# Patient Record
Sex: Female | Born: 1990 | Race: White | Hispanic: No | Marital: Single | Smoking: Current every day smoker
Health system: Southern US, Community
[De-identification: ages and names within clinical notes are randomized; demographics above are authoritative.]

## PROBLEM LIST (undated history)

## (undated) DIAGNOSIS — K219 Gastro-esophageal reflux disease without esophagitis: Secondary | ICD-10-CM

## (undated) DIAGNOSIS — O0990 Supervision of high risk pregnancy, unspecified, unspecified trimester: Secondary | ICD-10-CM

## (undated) DIAGNOSIS — Z789 Other specified health status: Secondary | ICD-10-CM

## (undated) HISTORY — PX: DILATION AND CURETTAGE, DIAGNOSTIC / THERAPEUTIC: SUR384

## (undated) HISTORY — DX: Gastro-esophageal reflux disease without esophagitis: K21.9

---
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
## 2015-10-15 ENCOUNTER — Encounter: Payer: Self-pay | Admitting: Family

## 2015-10-29 ENCOUNTER — Encounter: Payer: Self-pay | Admitting: *Deleted

## 2015-10-29 ENCOUNTER — Other Ambulatory Visit (HOSPITAL_COMMUNITY)
Admission: RE | Admit: 2015-10-29 | Discharge: 2015-10-29 | Disposition: A | Discharge status: Home / Self Care | Payer: Medicaid Other | Source: Ambulatory Visit | Attending: Family | Admitting: Family

## 2015-10-29 ENCOUNTER — Encounter: Payer: Self-pay | Admitting: Advanced Practice Midwife

## 2015-10-29 ENCOUNTER — Ambulatory Visit (INDEPENDENT_AMBULATORY_CARE_PROVIDER_SITE_OTHER): Payer: Medicaid Other | Admitting: Advanced Practice Midwife

## 2015-10-29 VITALS — BP 120/75 | HR 84 | Ht 64.0 in | Wt 143.0 lb

## 2015-10-29 DIAGNOSIS — Z113 Encounter for screening for infections with a predominantly sexual mode of transmission: Secondary | ICD-10-CM | POA: Insufficient documentation

## 2015-10-29 DIAGNOSIS — Z124 Encounter for screening for malignant neoplasm of cervix: Secondary | ICD-10-CM | POA: Diagnosis not present

## 2015-10-29 DIAGNOSIS — Z3689 Encounter for other specified antenatal screening: Secondary | ICD-10-CM | POA: Diagnosis not present

## 2015-10-29 DIAGNOSIS — Z8742 Personal history of other diseases of the female genital tract: Secondary | ICD-10-CM | POA: Insufficient documentation

## 2015-10-29 DIAGNOSIS — O0990 Supervision of high risk pregnancy, unspecified, unspecified trimester: Secondary | ICD-10-CM

## 2015-10-29 DIAGNOSIS — Z01419 Encounter for gynecological examination (general) (routine) without abnormal findings: Secondary | ICD-10-CM | POA: Diagnosis not present

## 2015-10-29 DIAGNOSIS — Z3491 Encounter for supervision of normal pregnancy, unspecified, first trimester: Secondary | ICD-10-CM | POA: Diagnosis not present

## 2015-10-29 DIAGNOSIS — Z8759 Personal history of other complications of pregnancy, childbirth and the puerperium: Secondary | ICD-10-CM

## 2015-10-29 DIAGNOSIS — Z3401 Encounter for supervision of normal first pregnancy, first trimester: Secondary | ICD-10-CM

## 2015-10-29 HISTORY — DX: Supervision of high risk pregnancy, unspecified, unspecified trimester: O09.90

## 2015-10-29 LAB — HEMOGLOBIN A1C
HEMOGLOBIN A1C: 4.9 % (ref <5.7)
Mean Plasma Glucose: 94 mg/dL

## 2015-10-29 NOTE — Patient Instructions (Signed)
Pap Test WHY AM I HAVING THIS TEST? A pap test is sometimes called a pap smear. It is a screening test that is used to check for signs of cancer of the vagina, cervix, and uterus. The test can also identify the presence of infection or precancerous changes. Your health care provider will likely recommend you have this test done on a regular basis. This test may be done:  Every 3 years, starting at age 26.  Every 5 years, in combination with testing for the presence of human papillomavirus (HPV).  More or less often depending on other medical conditions.  WHAT KIND OF SAMPLE IS TAKEN? Using a small cotton swab, plastic spatula, or brush, your health care provider will collect a sample of cells from the surface of your cervix. Your cervix is the opening to your uterus, also called a womb. Secretions from the cervix and vagina may also be collected. HOW DO I PREPARE FOR THE TEST?  Be aware of where you are in your menstrual cycle. You may be asked to reschedule the test if you are menstruating on the day of the test.  You may need to reschedule if you have a known vaginal infection on the day of the test.  You may be asked to avoid douching or taking a bath the day before or the day of the test.  Some medicines can cause abnormal test results, such as digitalis and tetracycline. Talk with your health care provider before your test if you take one of these medicines. WHAT DO THE RESULTS MEAN? Abnormal test results may indicate a number of health conditions. These may include:  Cancer. Although pap test results cannot be used to diagnose cancer of the cervix, vagina, or uterus, they may suggest the possibility of cancer. Further tests would be required to determine if cancer is present.  Sexually transmitted disease.  Fungal infection.  Parasite infection.  Herpes infection.  A condition causing or contributing to infertility. It is your responsibility to obtain your test results. Ask  the lab or department performing the test when and how you will get your results. Contact your health care provider to discuss any questions you have about your results.   This information is not intended to replace advice given to you by your health care provider. Make sure you discuss any questions you have with your health care provider.   Document Released: 03/11/2002 Document Revised: 01/09/2014 Document Reviewed: 05/12/2013 Elsevier Interactive Patient Education [employer]2016 Elsevier Inc. First Trimester of Pregnancy The first trimester of pregnancy is from week 1 until the end of week 12 (months 1 through 3). A week after a sperm fertilizes an egg, the egg will implant on the wall of the uterus. This embryo will begin to develop into a baby. Genes from you and your partner are forming the baby. The female genes determine whether the baby is a boy or a girl. At 6-8 weeks, the eyes and face are formed, and the heartbeat can be seen on ultrasound. At the end of 12 weeks, all the baby's organs are formed.  Now that you are pregnant, you will want to do everything you can to have a healthy baby. Two of the most important things are to get good prenatal care and to follow your health care provider's instructions. Prenatal care is all the medical care you receive before the baby's birth. This care will help prevent, find, and treat any problems during the pregnancy and childbirth. BODY CHANGES Your body goes  through many changes during pregnancy. The changes vary from woman to woman.   You may gain or lose a couple of pounds at first.  You may feel sick to your stomach (nauseous) and throw up (vomit). If the vomiting is uncontrollable, call your health care provider.  You may tire easily.  You may develop headaches that can be relieved by medicines approved by your health care provider.  You may urinate more often. Painful urination may mean you have a bladder infection.  You may develop heartburn as a  result of your pregnancy.  You may develop constipation because certain hormones are causing the muscles that push waste through your intestines to slow down.  You may develop hemorrhoids or swollen, bulging veins (varicose veins).  Your breasts may begin to grow larger and become tender. Your nipples may stick out more, and the tissue that surrounds them (areola) may become darker.  Your gums may bleed and may be sensitive to brushing and flossing.  Dark spots or blotches (chloasma, mask of pregnancy) may develop on your face. This will likely fade after the baby is born.  Your menstrual periods will stop.  You may have a loss of appetite.  You may develop cravings for certain kinds of food.  You may have changes in your emotions from day to day, such as being excited to be pregnant or being concerned that something may go wrong with the pregnancy and baby.  You may have more vivid and strange dreams.  You may have changes in your hair. These can include thickening of your hair, rapid growth, and changes in texture. Some women also have hair loss during or after pregnancy, or hair that feels dry or thin. Your hair will most likely return to normal after your baby is born. WHAT TO EXPECT AT YOUR PRENATAL VISITS During a routine prenatal visit:  You will be weighed to make sure you and the baby are growing normally.  Your blood pressure will be taken.  Your abdomen will be measured to track your baby's growth.  The fetal heartbeat will be listened to starting around week 10 or 12 of your pregnancy.  Test results from any previous visits will be discussed. Your health care provider may ask you:  How you are feeling.  If you are feeling the baby move.  If you have had any abnormal symptoms, such as leaking fluid, bleeding, severe headaches, or abdominal cramping.  If you are using any tobacco products, including cigarettes, chewing tobacco, and electronic cigarettes.  If you  have any questions. Other tests that may be performed during your first trimester include:  Blood tests to find your blood type and to check for the presence of any previous infections. They will also be used to check for low iron levels (anemia) and Rh antibodies. Later in the pregnancy, blood tests for diabetes will be done along with other tests if problems develop.  Urine tests to check for infections, diabetes, or protein in the urine.  An ultrasound to confirm the proper growth and development of the baby.  An amniocentesis to check for possible genetic problems.  Fetal screens for spina bifida and Down syndrome.  You may need other tests to make sure you and the baby are doing well.  HIV (human immunodeficiency virus) testing. Routine prenatal testing includes screening for HIV, unless you choose not to have this test. HOME CARE INSTRUCTIONS  Medicines  Follow your health care provider's instructions regarding medicine use. Specific medicines  may be either safe or unsafe to take during pregnancy.  Take your prenatal vitamins as directed.  If you develop constipation, try taking a stool softener if your health care provider approves. Diet  Eat regular, well-balanced meals. Choose a variety of foods, such as meat or vegetable-based protein, fish, milk and low-fat dairy products, vegetables, fruits, and whole grain breads and cereals. Your health care provider will help you determine the amount of weight gain that is right for you.  Avoid raw meat and uncooked cheese. These carry germs that can cause birth defects in the baby.  Eating four or five small meals rather than three large meals a day may help relieve nausea and vomiting. If you start to feel nauseous, eating a few soda crackers can be helpful. Drinking liquids between meals instead of during meals also seems to help nausea and vomiting.  If you develop constipation, eat more high-fiber foods, such as fresh vegetables or  fruit and whole grains. Drink enough fluids to keep your urine clear or pale yellow. Activity and Exercise  Exercise only as directed by your health care provider. Exercising will help you:  Control your weight.  Stay in shape.  Be prepared for labor and delivery.  Experiencing pain or cramping in the lower abdomen or low back is a good sign that you should stop exercising. Check with your health care provider before continuing normal exercises.  Try to avoid standing for long periods of time. Move your legs often if you must stand in one place for a long time.  Avoid heavy lifting.  Wear low-heeled shoes, and practice good posture.  You may continue to have sex unless your health care provider directs you otherwise. Relief of Pain or Discomfort  Wear a good support bra for breast tenderness.   Take warm sitz baths to soothe any pain or discomfort caused by hemorrhoids. Use hemorrhoid cream if your health care provider approves.   Rest with your legs elevated if you have leg cramps or low back pain.  If you develop varicose veins in your legs, wear support hose. Elevate your feet for 15 minutes, 3-4 times a day. Limit salt in your diet. Prenatal Care  Schedule your prenatal visits by the twelfth week of pregnancy. They are usually scheduled monthly at first, then more often in the last 2 months before delivery.  Write down your questions. Take them to your prenatal visits.  Keep all your prenatal visits as directed by your health care provider. Safety  Wear your seat belt at all times when driving.  Make a list of emergency phone numbers, including numbers for family, friends, the hospital, and police and fire departments. General Tips  Ask your health care provider for a referral to a local prenatal education class. Begin classes no later than at the beginning of month 6 of your pregnancy.  Ask for help if you have counseling or nutritional needs during pregnancy. Your  health care provider can offer advice or refer you to specialists for help with various needs.  Do not use hot tubs, steam rooms, or saunas.  Do not douche or use tampons or scented sanitary pads.  Do not cross your legs for long periods of time.  Avoid cat litter boxes and soil used by cats. These carry germs that can cause birth defects in the baby and possibly loss of the fetus by miscarriage or stillbirth.  Avoid all smoking, herbs, alcohol, and medicines not prescribed by your health care provider.  Chemicals in these affect the formation and growth of the baby.  Do not use any tobacco products, including cigarettes, chewing tobacco, and electronic cigarettes. If you need help quitting, ask your health care provider. You may receive counseling support and other resources to help you quit.  Schedule a dentist appointment. At home, brush your teeth with a soft toothbrush and be gentle when you floss. SEEK MEDICAL CARE IF:   You have dizziness.  You have mild pelvic cramps, pelvic pressure, or nagging pain in the abdominal area.  You have persistent nausea, vomiting, or diarrhea.  You have a bad smelling vaginal discharge.  You have pain with urination.  You notice increased swelling in your face, hands, legs, or ankles. SEEK IMMEDIATE MEDICAL CARE IF:   You have a fever.  You are leaking fluid from your vagina.  You have spotting or bleeding from your vagina.  You have severe abdominal cramping or pain.  You have rapid weight gain or loss.  You vomit blood or material that looks like coffee grounds.  You are exposed to [place] measles and have never had them.  You are exposed to fifth disease or chickenpox.  You develop a severe headache.  You have shortness of breath.  You have any kind of trauma, such as from a fall or a car accident.   This information is not intended to replace advice given to you by your health care provider. Make sure you discuss any  questions you have with your health care provider.   Document Released: 12/13/2000 Document Revised: 01/09/2014 Document Reviewed: 10/29/2012 Elsevier Interactive Patient Education [employer]

## 2015-10-29 NOTE — Progress Notes (Signed)
Bedside U/S shows IUP and CRL is 53.365mm GA is 12w and pos FHT

## 2015-10-29 NOTE — Progress Notes (Signed)
  Subjective:    Teresa Blanchard is a [id number] 10448w6d being seen today for her first obstetrical visit.  Her obstetrical history is significant for History of 16 week loss, unclear if it was abruption or incompletent cervix. Patient does intend to breast feed. Pregnancy history fully reviewed.  Patient reports no complaints.  Vitals:   10/29/15 0853 10/29/15 0855  BP: 120/75   Pulse: 84   Weight: 143 lb (64.9 kg)   Height:  5\' 4"  (1.626 m)    HISTORY: OB History  Gravida Para Term Preterm AB Living  3 1 1   1 1   SAB TAB Ectopic Multiple Live Births  1       1    # Outcome Date GA Lbr Len/2nd Weight Sex Delivery Anes PTL Lv  3 Current           2 SAB 2014 3772w0d      N FD  1 Term 2013   7 lb 11 oz (3.487 kg)  Vag-Spont  N LIV     History reviewed. No pertinent past medical history. Past Surgical History:  Procedure Laterality Date  . DILATION AND CURETTAGE, DIAGNOSTIC / THERAPEUTIC     Family History  Problem Relation Age of Onset  . Breast cancer Other      Exam    Uterus:  Fundal Height: 12 cm  Pelvic Exam:    Perineum: No Hemorrhoids, Normal Perineum   Vulva: Bartholin's, Urethra, Skene's normal   Vagina:  normal mucosa, normal discharge   pH:    Cervix: multiparous appearance   Adnexa: no mass, fullness, tenderness   Bony Pelvis: gynecoid  System: Breast:  normal appearance, no masses or tenderness   Skin: normal coloration and turgor, no rashes    Neurologic: oriented, grossly non-focal   Extremities: normal strength, tone, and muscle mass   HEENT neck supple with midline trachea   Mouth/Teeth mucous membranes moist, pharynx normal without lesions   Neck supple   Cardiovascular: regular rate and rhythm   Respiratory:  appears well, vitals normal, no respiratory distress, acyanotic, normal RR, ear and throat exam is normal, neck free of mass or lymphadenopathy, chest clear, no wheezing, crepitations, rhonchi, normal symmetric air entry   Abdomen: soft,  non-tender; bowel sounds normal; no masses,  no organomegaly   Urinary: urethral meatus normal   Pap done   Assessment:    Pregnancy: G3P1011 Patient Active Problem List   Diagnosis Date Noted  . History of 1 spontaneous abortion 10/29/2015  . Supervision of normal pregnancy in first trimester 10/29/2015        Plan:     Initial labs drawn. Prenatal vitamins. Problem list reviewed and updated. Genetic Screening discussed First Screen: ordered.  Ultrasound discussed; fetal survey: requested.  Follow up in 4 weeks. 50% of 30 min visit spent on counseling and coordination of care.   Routines reviewed  [facility] week loss (second preg)  She delivered at home with PPROM and then hemorrhaged. Unclear if it was incompetent cervix, PPROM, or abruption   [facility] Center[facility]ILLIAMS,Remas Sobel 10/29/2015

## 2015-10-30 LAB — GLUCOSE, RANDOM: Glucose, Bld: 70 mg/dL (ref 65–99)

## 2015-10-31 LAB — CULTURE, OB URINE
COLONY COUNT: NO GROWTH
ORGANISM ID, BACTERIA: NO GROWTH

## 2015-11-01 LAB — CYTOLOGY - PAP
CHLAMYDIA, DNA PROBE: NEGATIVE
DIAGNOSIS: NEGATIVE
NEISSERIA GONORRHEA: NEGATIVE

## 2015-11-01 LAB — PRENATAL PROFILE (SOLSTAS)
Antibody Screen: NEGATIVE
BASOS ABS: 0 {cells}/uL (ref 0–200)
BASOS PCT: 0 %
EOS ABS: 76 {cells}/uL (ref 15–500)
Eosinophils Relative: 1 %
HCT: 42.6 % (ref 35.0–45.0)
HIV: NONREACTIVE
Hemoglobin: 14.3 g/dL (ref 11.7–15.5)
Hepatitis B Surface Ag: NEGATIVE
LYMPHS PCT: 26 %
Lymphs Abs: 1976 cells/uL (ref 850–3900)
MCH: 28.7 pg (ref 27.0–33.0)
MCHC: 33.6 g/dL (ref 32.0–36.0)
MCV: 85.5 fL (ref 80.0–100.0)
MONO ABS: 532 {cells}/uL (ref 200–950)
MONOS PCT: 7 %
MPV: 11 fL (ref 7.5–12.5)
Neutro Abs: 5016 cells/uL (ref 1500–7800)
Neutrophils Relative %: 66 %
PLATELETS: 159 10*3/uL (ref 140–400)
RBC: 4.98 MIL/uL (ref 3.80–5.10)
RDW: 13.1 % (ref 11.0–15.0)
RH TYPE: POSITIVE
RUBELLA: 6.95 {index} — AB (ref <0.90)
WBC: 7.6 10*3/uL (ref 3.8–10.8)

## 2015-11-08 ENCOUNTER — Encounter (HOSPITAL_COMMUNITY): Payer: Self-pay

## 2015-11-08 ENCOUNTER — Ambulatory Visit (HOSPITAL_COMMUNITY): Admission: RE | Admit: 2015-11-08 | Payer: Medicaid Other | Source: Ambulatory Visit

## 2015-11-08 ENCOUNTER — Other Ambulatory Visit (HOSPITAL_COMMUNITY): Payer: Self-pay | Admitting: *Deleted

## 2015-11-08 ENCOUNTER — Other Ambulatory Visit: Payer: Self-pay | Admitting: Advanced Practice Midwife

## 2015-11-08 ENCOUNTER — Ambulatory Visit (HOSPITAL_COMMUNITY)
Admission: RE | Admit: 2015-11-08 | Discharge: 2015-11-08 | Disposition: A | Discharge status: Home / Self Care | Payer: Medicaid Other | Source: Ambulatory Visit | Attending: Advanced Practice Midwife | Admitting: Advanced Practice Midwife

## 2015-11-08 DIAGNOSIS — Z3491 Encounter for supervision of normal pregnancy, unspecified, first trimester: Secondary | ICD-10-CM

## 2015-11-08 DIAGNOSIS — Z8742 Personal history of other diseases of the female genital tract: Secondary | ICD-10-CM

## 2015-11-08 DIAGNOSIS — Z3682 Encounter for antenatal screening for nuchal translucency: Secondary | ICD-10-CM | POA: Diagnosis not present

## 2015-11-08 DIAGNOSIS — Z3A13 13 weeks gestation of pregnancy: Secondary | ICD-10-CM | POA: Insufficient documentation

## 2015-11-08 DIAGNOSIS — Z8759 Personal history of other complications of pregnancy, childbirth and the puerperium: Secondary | ICD-10-CM

## 2015-11-08 HISTORY — DX: Other specified health status: Z78.9

## 2015-11-11 ENCOUNTER — Ambulatory Visit (HOSPITAL_COMMUNITY)
Admission: RE | Admit: 2015-11-11 | Discharge: 2015-11-11 | Disposition: A | Discharge status: Home / Self Care | Payer: Medicaid Other | Source: Ambulatory Visit | Attending: Advanced Practice Midwife | Admitting: Advanced Practice Midwife

## 2015-11-11 ENCOUNTER — Other Ambulatory Visit (HOSPITAL_COMMUNITY): Payer: Self-pay | Admitting: *Deleted

## 2015-11-11 ENCOUNTER — Other Ambulatory Visit (HOSPITAL_COMMUNITY): Payer: Self-pay | Admitting: Obstetrics and Gynecology

## 2015-11-11 ENCOUNTER — Encounter (HOSPITAL_COMMUNITY): Payer: Self-pay

## 2015-11-11 DIAGNOSIS — O09291 Supervision of pregnancy with other poor reproductive or obstetric history, first trimester: Secondary | ICD-10-CM | POA: Insufficient documentation

## 2015-11-11 DIAGNOSIS — Z3682 Encounter for antenatal screening for nuchal translucency: Secondary | ICD-10-CM | POA: Diagnosis present

## 2015-11-11 DIAGNOSIS — Z3A13 13 weeks gestation of pregnancy: Secondary | ICD-10-CM | POA: Insufficient documentation

## 2015-11-11 DIAGNOSIS — Z3689 Encounter for other specified antenatal screening: Secondary | ICD-10-CM

## 2015-11-16 ENCOUNTER — Encounter: Payer: Self-pay | Admitting: *Deleted

## 2015-11-19 ENCOUNTER — Other Ambulatory Visit (HOSPITAL_COMMUNITY): Payer: Self-pay

## 2015-11-29 ENCOUNTER — Ambulatory Visit (INDEPENDENT_AMBULATORY_CARE_PROVIDER_SITE_OTHER): Payer: Medicaid Other | Admitting: Advanced Practice Midwife

## 2015-11-29 ENCOUNTER — Encounter: Payer: Self-pay | Admitting: Advanced Practice Midwife

## 2015-11-29 VITALS — BP 109/69 | HR 80 | Wt 146.0 lb

## 2015-11-29 DIAGNOSIS — Z3401 Encounter for supervision of normal first pregnancy, first trimester: Secondary | ICD-10-CM

## 2015-11-29 DIAGNOSIS — J019 Acute sinusitis, unspecified: Secondary | ICD-10-CM

## 2015-11-29 DIAGNOSIS — Z8759 Personal history of other complications of pregnancy, childbirth and the puerperium: Secondary | ICD-10-CM

## 2015-11-29 DIAGNOSIS — Z3A16 16 weeks gestation of pregnancy: Secondary | ICD-10-CM

## 2015-11-29 DIAGNOSIS — O09292 Supervision of pregnancy with other poor reproductive or obstetric history, second trimester: Secondary | ICD-10-CM

## 2015-11-29 DIAGNOSIS — Z8742 Personal history of other diseases of the female genital tract: Secondary | ICD-10-CM

## 2015-11-29 MED ORDER — AZITHROMYCIN 250 MG PO TABS: ORAL_TABLET | ORAL | 1 refills | Status: DC

## 2015-11-29 MED ORDER — SINUS RINSE BOTTLE KIT NA PACK: 1.0000 | PACK | NASAL | 1 refills | Status: DC

## 2015-11-29 NOTE — Addendum Note (Signed)
Addended by: Kathie DikeSOLA, Ayomikun Starling J on: 11/29/2015 11:47 AM   Modules accepted: Orders

## 2015-11-29 NOTE — Patient Instructions (Signed)

## 2015-11-29 NOTE — Progress Notes (Signed)
   PRENATAL VISIT NOTE  Subjective:  Teresa Blanchard is a 25 y.o. G3P1011 at 19w2dbeing seen today for ongoing prenatal care.  She is currently monitored for the following issues for this high-risk pregnancy and has History of 1 spontaneous abortion and Supervision of normal pregnancy in first trimester on her problem list.  Patient reports Sinus congetions, pressure, green mucus, cough x 2 weeks. No improvement w/ mucinex. Denies fever, chills, body aches, SOB. .  Contractions: Not present. Vag. Bleeding: None.  Movement: Present. Denies leaking of fluid.   The following portions of the patient's history were reviewed and updated as appropriate: allergies, current medications, past family history, past medical history, past social history, past surgical history and problem list. Problem list updated.  Objective:   Vitals:   11/29/15 0928  BP: 109/69  Pulse: 80  Weight: 146 lb (66.2 kg)    Fetal Status: Fetal Heart Rate (bpm): 141 Fundal Height: 16 cm Movement: Present     General:  Alert, oriented and cooperative. Patient is in no acute distress.  Skin: Skin is warm and dry. No rash noted.   Cardiovascular: Normal heart rate noted  Respiratory: Normal respiratory effort, no problems with respiration noted  Abdomen: Soft, gravid, appropriate for gestational age. Pain/Pressure: Present     Pelvic:  Cervical exam deferred        Extremities: Normal range of motion.  Edema: None  Mental Status: Normal mood and affect. Normal behavior. Normal judgment and thought content.   Assessment and Plan:  Pregnancy: G3P1011 at 129w2d1. [redacted] weeks gestation of pregnancy  - AFP/Quad Scr - [place] OB Transvaginal; Future - AMB referral to maternal fetal medicine  2. History of incompetent cervix, currently pregnant in second trimester  - [place] OB Transvaginal; Future - AMB referral to maternal fetal medicine to discuss if pt should be Tx'd as having had true Hx incompetent cervix.   3.  Acute non-recurrent sinusitis, unspecified location  - azithromycin (ZITHROMAX) 250 MG tablet; Take as directed: Two pills by mouth the first day, then one pill every day until completed  Dispense: 6 tablet; Refill: 1 - Hypertonic Nasal Wash (SINUS RINSE BOTTLE KIT) PACK; Place 1 each into the nose as directed.  Dispense: 1 each; Refill: 1  4. Encounter for supervision of normal first pregnancy in first trimester   5. History of 1 spontaneous abortion   Preterm labor symptoms and general obstetric precautions including but not limited to vaginal bleeding, contractions, leaking of fluid and fetal movement were reviewed in detail with the patient. Please refer to After Visit Summary for other counseling recommendations.  Return in about 4 weeks (around 12/27/2015) for ROB.   ViManya SilvasCNM

## 2015-11-30 LAB — ALPHA FETOPROTEIN, MATERNAL
AFP: 34 ng/mL
Curr Gest Age: 16.3 weeks
MoM for AFP: 0.96
OPEN SPINA BIFIDA: NEGATIVE

## 2015-12-09 ENCOUNTER — Ambulatory Visit (HOSPITAL_COMMUNITY): Payer: Medicaid Other

## 2015-12-10 ENCOUNTER — Other Ambulatory Visit (HOSPITAL_COMMUNITY): Payer: Self-pay | Admitting: *Deleted

## 2015-12-10 ENCOUNTER — Encounter (HOSPITAL_COMMUNITY): Payer: Self-pay

## 2015-12-10 ENCOUNTER — Ambulatory Visit (HOSPITAL_COMMUNITY)
Admission: RE | Admit: 2015-12-10 | Discharge: 2015-12-10 | Disposition: A | Discharge status: Home / Self Care | Payer: Medicaid Other | Source: Ambulatory Visit | Attending: Advanced Practice Midwife | Admitting: Advanced Practice Midwife

## 2015-12-10 DIAGNOSIS — O09292 Supervision of pregnancy with other poor reproductive or obstetric history, second trimester: Secondary | ICD-10-CM

## 2015-12-10 DIAGNOSIS — Z3A17 17 weeks gestation of pregnancy: Secondary | ICD-10-CM | POA: Insufficient documentation

## 2015-12-10 DIAGNOSIS — Z3A16 16 weeks gestation of pregnancy: Secondary | ICD-10-CM

## 2015-12-10 DIAGNOSIS — O3432 Maternal care for cervical incompetence, second trimester: Secondary | ICD-10-CM | POA: Insufficient documentation

## 2015-12-10 NOTE — ED Notes (Signed)
Pt reports LLQ pain with movement.

## 2015-12-10 NOTE — Consult Note (Signed)
Maternal Fetal Medicine Consultation  Requesting Provider(s): Manya Silvas, CNM  Reason for consultation:  History of 16 week loss  HPI:  Teresa Blanchard is a 25 yo G3P1011, EDD 05/13/2016 who is currently at [place] 6d seen for consultation due to a previous 16 week pregnancy loss.  Her first pregnancy was an uncomplicated term SVD - reports some elevated blood pressures at the time of delivery was never given the diagnosis of preeclampsia.  The patient reports that at 17 weeks with her second pregnancy, she developed cramping and lower abdominal pain - was seen in the clinic and was told that she had a normal exam and a normal ultrasound.  Later that evening, she experienced premature rupture of membranes and delivered at home shortly thereafter.  She ultimately required a D&C to remove the products of conception. (medical records from the pregnancy were not available for review at the time of consultation)  Teresa Blanchard is without complaints today. She recently completed a course of azithromycin for sinusitis. Her prenatal course has been complicated by a smoking history (5-6 cigarettes/ day) but no other issues.   OB History: OB History    Gravida Para Term Preterm AB Living   '3 1 1   1 1   ' SAB TAB Ectopic Multiple Live Births   1       1      PMH:  Past Medical History:  Diagnosis Date  . Medical history non-contributory     PSH:  Past Surgical History:  Procedure Laterality Date  . DILATION AND CURETTAGE, DIAGNOSTIC / THERAPEUTIC     Meds:  Current Outpatient Prescriptions on File Prior to Encounter  Medication Sig Dispense Refill  . azithromycin (ZITHROMAX) 250 MG tablet Take as directed: Two pills by mouth the first day, then one pill every day until completed (Patient not taking: Reported on 12/10/2015) 6 tablet 1  . Hypertonic Nasal Wash (SINUS RINSE BOTTLE KIT) PACK Place 1 each into the nose as directed. (Patient not taking: Reported on 12/10/2015) 1 each 1  . PRENATAL  VIT-FE FUMARATE-FA PO Take by mouth.     No current facility-administered medications on file prior to encounter.    Allergies: No Known Allergies   FH:  Family History  Problem Relation Age of Onset  . Breast cancer Other   Reports paternal uncle with William's syndrome.  No other history of hereditary disorders or birth defects.  Soc:  Social History   Social History  . Marital status: Single    Spouse name: N/A  . Number of children: N/A  . Years of education: N/A   Occupational History  . Not on file.   Social History Main Topics  . Smoking status: Light Tobacco Smoker    Packs/day: 0.25    Types: Cigarettes  . Smokeless tobacco: Never Used  . Alcohol use No  . Drug use: No  . Sexual activity: Yes    Birth control/ protection: None, Inserts   Other Topics Concern  . Not on file   Social History Narrative  . No narrative on file    Review of Systems: no vaginal bleeding or cramping/contractions, no LOF, no nausea/vomiting. All other systems reviewed and are negative.  PE:  Wt. 147 lbs, 124/76, 94  GEN: well-appearing female ABD: gravid, NT  Ultrasound: Single IUP at 17w 6d History of previous 17 week loss Limited ultrasound performed for cervical length Normal amniotic fluid volume  TVUS - cervical length 3.5 cm without funneling or dynamic  changes   A/P: 1) Single IUP at 17w 6d  2) History of previous 16 week fetal loss - the patient does not give a history that would be suggestive of cervical insufficiency.  Would not expect the degree of cramping and abdominal pain with cervical insufficiency and would appear to be more consistent with abruption.  We briefly discussed cerclage for cervical insufficiency and the fact that the procedure would not be indicated with abruption or other etiology of mid-trimester loss.  Nevertheless, would recommend cervical length surveillance until [redacted] weeks gestation.  Recommendations: 1) Ultrasound for fetal anatomy and  cervical length in 2 weeks (tentatively scheduled for 21 December) 2) Cervical length ultrasounds every 1-2 weeks until 24 weeks; if cervical shortening is noted, would offer ultrasound indicated cerclage given the patient's history of a 16 week loss.   Thank you for the opportunity to be a part of the care of [facility]. Please contact our office if we can be of further assistance.   I spent approximately 30 minutes with this patient with over 50% of time spent in face-to-face counseling.  Benjaman Lobe, MD Maternal Fetal Medicine

## 2015-12-15 ENCOUNTER — Encounter: Payer: Self-pay | Admitting: Advanced Practice Midwife

## 2015-12-23 ENCOUNTER — Other Ambulatory Visit (HOSPITAL_COMMUNITY): Payer: Self-pay | Admitting: Obstetrics and Gynecology

## 2015-12-23 ENCOUNTER — Ambulatory Visit (HOSPITAL_COMMUNITY)
Admission: RE | Admit: 2015-12-23 | Discharge: 2015-12-23 | Disposition: A | Discharge status: Home / Self Care | Payer: Medicaid Other | Source: Ambulatory Visit | Attending: Advanced Practice Midwife | Admitting: Advanced Practice Midwife

## 2015-12-23 ENCOUNTER — Other Ambulatory Visit (HOSPITAL_COMMUNITY): Payer: Self-pay | Admitting: Maternal and Fetal Medicine

## 2015-12-23 ENCOUNTER — Encounter (HOSPITAL_COMMUNITY): Payer: Self-pay

## 2015-12-23 DIAGNOSIS — Z363 Encounter for antenatal screening for malformations: Secondary | ICD-10-CM

## 2015-12-23 DIAGNOSIS — O99332 Smoking (tobacco) complicating pregnancy, second trimester: Secondary | ICD-10-CM | POA: Diagnosis not present

## 2015-12-23 DIAGNOSIS — O09292 Supervision of pregnancy with other poor reproductive or obstetric history, second trimester: Secondary | ICD-10-CM | POA: Diagnosis not present

## 2015-12-23 DIAGNOSIS — Z3689 Encounter for other specified antenatal screening: Secondary | ICD-10-CM

## 2015-12-23 DIAGNOSIS — Z3A19 19 weeks gestation of pregnancy: Secondary | ICD-10-CM | POA: Diagnosis not present

## 2016-01-03 NOTE — L&D Delivery Note (Signed)
Delivery Note At 8:09 AM a viable female was delivered via  (Presentation: LOA).  APGAR: 8, 9; weight pending .   Placenta status: Spontaneous, intact.  Cord: 3 vessels, clamped and cut after 60 seconds.   Anesthesia:  epidural Episiotomy: None Lacerations: None Suture Repair: n/a Est. Blood Loss (mL):  100  Mom to postpartum.  Baby to Couplet care / Skin to Skin.  Cleone SlimCaroline Neill SNM 05/14/2016, 8:28 AM

## 2016-01-05 ENCOUNTER — Encounter (HOSPITAL_COMMUNITY): Payer: Self-pay

## 2016-01-05 ENCOUNTER — Ambulatory Visit (HOSPITAL_COMMUNITY)
Admission: RE | Admit: 2016-01-05 | Discharge: 2016-01-05 | Disposition: A | Discharge status: Home / Self Care | Payer: Medicaid Other | Source: Ambulatory Visit | Attending: Advanced Practice Midwife | Admitting: Advanced Practice Midwife

## 2016-01-05 DIAGNOSIS — O09292 Supervision of pregnancy with other poor reproductive or obstetric history, second trimester: Secondary | ICD-10-CM | POA: Diagnosis present

## 2016-01-05 DIAGNOSIS — O99332 Smoking (tobacco) complicating pregnancy, second trimester: Secondary | ICD-10-CM | POA: Insufficient documentation

## 2016-01-05 DIAGNOSIS — Z3A21 21 weeks gestation of pregnancy: Secondary | ICD-10-CM | POA: Diagnosis not present

## 2016-01-07 ENCOUNTER — Other Ambulatory Visit (HOSPITAL_COMMUNITY): Payer: Medicaid Other

## 2016-01-07 ENCOUNTER — Ambulatory Visit (INDEPENDENT_AMBULATORY_CARE_PROVIDER_SITE_OTHER): Payer: Medicaid Other | Admitting: Family

## 2016-01-07 DIAGNOSIS — O09292 Supervision of pregnancy with other poor reproductive or obstetric history, second trimester: Secondary | ICD-10-CM | POA: Insufficient documentation

## 2016-01-07 DIAGNOSIS — O0990 Supervision of high risk pregnancy, unspecified, unspecified trimester: Secondary | ICD-10-CM

## 2016-01-07 NOTE — Patient Instructions (Signed)
Echogenic intracardiac focus (EIF) is a small bright spot seen in the baby's heart on an ultrasound exam. This is thought to represent mineralization, or small deposits of calcium, in the muscle of the heart. EIFs are found in about 3-5% of normal pregnancies and cause no health problems.   CIRCUMCISION  Circumcision is considered an elective/non-medically necessary procedure. There are many reasons parents decide to have their sons circumsized. During the first year of life circumcised males have a reduced risk of urinary tract infections but after this year the rates between circumcised males and uncircumcised males are the same.  It is safe to have your son circumcised outside of the hospital and the places above perform them regularly.    Places to have your son circumcised:    [facility] [phone] $480 by 4 wks  [facility] [phone] $244 by 4 wks  [facility] [phone] $175 by 2 wks  [facility] [phone] $250 by 7 days [facility] [phone] $150 by 4 wks  These prices sometimes change but are roughly what you can expect to pay. Please call and confirm pricing.

## 2016-01-07 NOTE — Progress Notes (Signed)
   PRENATAL VISIT NOTE  Subjective:  Teresa Blanchard is a 26 y.o. G3P1011 at 3541w6d being seen today for ongoing prenatal care.  She is currently monitored for the following issues for this high-risk pregnancy and has History of 1 spontaneous abortion; Supervision of high risk pregnancy, antepartum; and Prior perinatal loss in second trimester, antepartum on her problem list.  Patient reports coughing that has improved since Zpak.  Decreased smoking to 2 packs/week, was smoking 1.5 pks/day.  Denies fever or chills.  Contractions: Not present. Vag. Bleeding: None.  Movement: Present. Denies leaking of fluid.   The following portions of the patient's history were reviewed and updated as appropriate: allergies, current medications, past family history, past medical history, past social history, past surgical history and problem list. Problem list updated.  Objective:   Vitals:   01/07/16 1042  BP: 121/69  Pulse: 79  Temp: 97.6 F (36.4 C)  Weight: 149 lb (67.6 kg)    Fetal Status: Fetal Heart Rate (bpm): 142   Movement: Present     General:  Alert, oriented and cooperative. Patient is in no acute distress.  Skin: Skin is warm and dry. No rash noted.   Cardiovascular: Normal heart rate noted  Respiratory: Normal respiratory effort, no problems with respiration noted; Lungs clear throughout auscultation  Abdomen: Soft, gravid, appropriate for gestational age. Pain/Pressure: Present     Pelvic:  Cervical exam deferred        Extremities: Normal range of motion.  Edema: None  Mental Status: Normal mood and affect. Normal behavior. Normal judgment and thought content.   Assessment and Plan:  Pregnancy: G3P1011 at 441w6d  1. Supervision of high risk pregnancy, antepartum - Reviewed ultrasound results (EIF), otherwise normal; (pt concerned, given additional information)  2. Prior perinatal loss in second trimester, antepartum - Cervical length scheduled for next week; stable w last US at  3.7 cm with fundal pressure  Preterm labor symptoms and general obstetric precautions including but not limited to vaginal bleeding, contractions, leaking of fluid and fetal movement were reviewed in detail with the patient. Please refer to After Visit Summary for other counseling recommendations.  Return in about 4 weeks (around 02/04/2016).   Eino FarberWalidah Kennith GainN Karim, CNM

## 2016-01-19 ENCOUNTER — Ambulatory Visit (HOSPITAL_COMMUNITY): Payer: Medicaid Other

## 2016-01-21 ENCOUNTER — Encounter (HOSPITAL_COMMUNITY): Payer: Self-pay

## 2016-01-21 ENCOUNTER — Ambulatory Visit (HOSPITAL_COMMUNITY)
Admission: RE | Admit: 2016-01-21 | Discharge: 2016-01-21 | Disposition: A | Discharge status: Home / Self Care | Payer: Medicaid Other | Source: Ambulatory Visit | Attending: Advanced Practice Midwife | Admitting: Advanced Practice Midwife

## 2016-01-21 ENCOUNTER — Other Ambulatory Visit (HOSPITAL_COMMUNITY): Payer: Self-pay | Admitting: *Deleted

## 2016-01-21 ENCOUNTER — Other Ambulatory Visit (HOSPITAL_COMMUNITY): Payer: Self-pay | Admitting: Maternal and Fetal Medicine

## 2016-01-21 DIAGNOSIS — O09292 Supervision of pregnancy with other poor reproductive or obstetric history, second trimester: Secondary | ICD-10-CM | POA: Insufficient documentation

## 2016-01-21 DIAGNOSIS — Z3A23 23 weeks gestation of pregnancy: Secondary | ICD-10-CM

## 2016-01-21 DIAGNOSIS — O99330 Smoking (tobacco) complicating pregnancy, unspecified trimester: Secondary | ICD-10-CM

## 2016-01-21 DIAGNOSIS — O0990 Supervision of high risk pregnancy, unspecified, unspecified trimester: Secondary | ICD-10-CM

## 2016-01-21 DIAGNOSIS — O99332 Smoking (tobacco) complicating pregnancy, second trimester: Secondary | ICD-10-CM

## 2016-02-04 ENCOUNTER — Ambulatory Visit (INDEPENDENT_AMBULATORY_CARE_PROVIDER_SITE_OTHER): Payer: Medicaid Other | Admitting: Family

## 2016-02-04 VITALS — BP 118/79 | HR 84 | Wt 156.0 lb

## 2016-02-04 DIAGNOSIS — O09292 Supervision of pregnancy with other poor reproductive or obstetric history, second trimester: Secondary | ICD-10-CM

## 2016-02-04 DIAGNOSIS — O0990 Supervision of high risk pregnancy, unspecified, unspecified trimester: Secondary | ICD-10-CM

## 2016-02-04 DIAGNOSIS — K219 Gastro-esophageal reflux disease without esophagitis: Secondary | ICD-10-CM | POA: Insufficient documentation

## 2016-02-04 MED ORDER — PANTOPRAZOLE SODIUM 20 MG PO TBEC: 20.0000 mg | DELAYED_RELEASE_TABLET | Freq: Every day | ORAL | 2 refills | Status: DC

## 2016-02-04 NOTE — Progress Notes (Signed)
Pt states she throws up in the mornings and "it is not like food, it is like foam".

## 2016-02-04 NOTE — Progress Notes (Signed)
   PRENATAL VISIT NOTE  Subjective:  Teresa Blanchard is a 26 y.o. G3P1011 at 4756w6d being seen today for ongoing prenatal care.  She is currently monitored for the following issues for this high-risk pregnancy and has History of 1 spontaneous abortion; Supervision of high risk pregnancy, antepartum; Prior perinatal loss in second trimester, antepartum; and Gastroesophageal reflux disease on her problem list.  Patient reports increased epigastric pain and vomiting up foam.  Contractions: Not present. Vag. Bleeding: None.  Movement: Present. Denies leaking of fluid.   The following portions of the patient's history were reviewed and updated as appropriate: allergies, current medications, past family history, past medical history, past social history, past surgical history and problem list. Problem list updated.  Objective:   Vitals:   02/04/16 1046  BP: 118/79  Pulse: 84  Weight: 156 lb (70.8 kg)    Fetal Status: Fetal Heart Rate (bpm): 144 Fundal Height: 25 cm Movement: Present     General:  Alert, oriented and cooperative. Patient is in no acute distress.  Skin: Skin is warm and dry. No rash noted.   Cardiovascular: Normal heart rate noted  Respiratory: Normal respiratory effort, no problems with respiration noted  Abdomen: Soft, gravid, appropriate for gestational age. Pain/Pressure: Present     Pelvic:  Cervical exam deferred        Extremities: Normal range of motion.  Edema: None  Mental Status: Normal mood and affect. Normal behavior. Normal judgment and thought content.   Assessment and Plan:  Pregnancy: G3P1011 at 5656w6d  1. Prior perinatal loss in second trimester, antepartum - Cervical lengths have been wnl - Folow-up ultrasound in two weeks  2. Gastroesophageal reflux disease, esophagitis presence not specified - pantoprazole (PROTONIX) 20 MG tablet; Take 1 tablet (20 mg total) by mouth daily.  Dispense: 30 tablet; Refill: 2  3. Supervision of high risk pregnancy,  antepartum - Third trimester labs at next appointment; explained must be fasting  Preterm labor symptoms and general obstetric precautions including but not limited to vaginal bleeding, contractions, leaking of fluid and fetal movement were reviewed in detail with the patient. Please refer to After Visit Summary for other counseling recommendations.  Return in about 3 weeks (around 02/25/2016).   Eino FarberWalidah Kennith GainN Karim, CNM

## 2016-02-18 ENCOUNTER — Other Ambulatory Visit (HOSPITAL_COMMUNITY): Payer: Self-pay | Admitting: Maternal and Fetal Medicine

## 2016-02-18 ENCOUNTER — Ambulatory Visit (HOSPITAL_COMMUNITY)
Admission: RE | Admit: 2016-02-18 | Discharge: 2016-02-18 | Disposition: A | Discharge status: Home / Self Care | Payer: Medicaid Other | Source: Ambulatory Visit | Attending: Advanced Practice Midwife | Admitting: Advanced Practice Midwife

## 2016-02-18 DIAGNOSIS — O99330 Smoking (tobacco) complicating pregnancy, unspecified trimester: Secondary | ICD-10-CM

## 2016-02-18 DIAGNOSIS — Z3A27 27 weeks gestation of pregnancy: Secondary | ICD-10-CM

## 2016-02-18 DIAGNOSIS — O09299 Supervision of pregnancy with other poor reproductive or obstetric history, unspecified trimester: Secondary | ICD-10-CM | POA: Insufficient documentation

## 2016-02-18 DIAGNOSIS — O99332 Smoking (tobacco) complicating pregnancy, second trimester: Secondary | ICD-10-CM | POA: Insufficient documentation

## 2016-02-25 ENCOUNTER — Ambulatory Visit (INDEPENDENT_AMBULATORY_CARE_PROVIDER_SITE_OTHER): Payer: Medicaid Other | Admitting: Advanced Practice Midwife

## 2016-02-25 VITALS — BP 110/75 | HR 72 | Wt 159.0 lb

## 2016-02-25 DIAGNOSIS — Z8759 Personal history of other complications of pregnancy, childbirth and the puerperium: Secondary | ICD-10-CM

## 2016-02-25 DIAGNOSIS — Z3493 Encounter for supervision of normal pregnancy, unspecified, third trimester: Secondary | ICD-10-CM

## 2016-02-25 DIAGNOSIS — Z8742 Personal history of other diseases of the female genital tract: Secondary | ICD-10-CM

## 2016-02-25 DIAGNOSIS — O0990 Supervision of high risk pregnancy, unspecified, unspecified trimester: Secondary | ICD-10-CM

## 2016-02-25 LAB — CBC
HEMATOCRIT: 33.8 % — AB (ref 35.0–45.0)
HEMOGLOBIN: 11.3 g/dL — AB (ref 11.7–15.5)
MCH: 28.3 pg (ref 27.0–33.0)
MCHC: 33.4 g/dL (ref 32.0–36.0)
MCV: 84.5 fL (ref 80.0–100.0)
MPV: 10.8 fL (ref 7.5–12.5)
Platelets: 129 10*3/uL — ABNORMAL LOW (ref 140–400)
RBC: 4 MIL/uL (ref 3.80–5.10)
RDW: 13.6 % (ref 11.0–15.0)
WBC: 9.6 10*3/uL (ref 3.8–10.8)

## 2016-02-25 NOTE — Patient Instructions (Signed)
Contraception Choices Contraception (birth control) is the use of any methods or devices to prevent pregnancy. Below are some methods to help avoid pregnancy. Hormonal methods  Contraceptive implant. This is a thin, plastic tube containing progesterone hormone. It does not contain estrogen hormone. Your health care provider inserts the tube in the inner part of the upper arm. The tube can remain in place for up to 3 years. After 3 years, the implant must be removed. The implant prevents the ovaries from releasing an egg (ovulation), thickens the cervical mucus to prevent sperm from entering the uterus, and thins the lining of the inside of the uterus.  Progesterone-only injections. These injections are given every 3 months by your health care provider to prevent pregnancy. This synthetic progesterone hormone stops the ovaries from releasing eggs. It also thickens cervical mucus and changes the uterine lining. This makes it harder for sperm to survive in the uterus.  Birth control pills. These pills contain estrogen and progesterone hormone. They work by preventing the ovaries from releasing eggs (ovulation). They also cause the cervical mucus to thicken, preventing the sperm from entering the uterus. Birth control pills are prescribed by a health care provider.Birth control pills can also be used to treat heavy periods.  Minipill. This type of birth control pill contains only the progesterone hormone. They are taken every day of each month and must be prescribed by your health care provider.  Birth control patch. The patch contains hormones similar to those in birth control pills. It must be changed once a week and is prescribed by a health care provider.  Vaginal ring. The ring contains hormones similar to those in birth control pills. It is left in the vagina for 3 weeks, removed for 1 week, and then a new one is put back in place. The patient must be comfortable inserting and removing the ring from  the vagina.A health care provider's prescription is necessary.  Emergency contraception. Emergency contraceptives prevent pregnancy after unprotected sexual intercourse. This pill can be taken right after sex or up to 5 days after unprotected sex. It is most effective the sooner you take the pills after having sexual intercourse. Most emergency contraceptive pills are available without a prescription. Check with your pharmacist. Do not use emergency contraception as your only form of birth control. Barrier methods  Female condom. This is a thin sheath (latex or rubber) that is worn over the penis during sexual intercourse. It can be used with spermicide to increase effectiveness.  Female condom. This is a soft, loose-fitting sheath that is put into the vagina before sexual intercourse.  Diaphragm. This is a soft, latex, dome-shaped barrier that must be fitted by a health care provider. It is inserted into the vagina, along with a spermicidal jelly. It is inserted before intercourse. The diaphragm should be left in the vagina for 6 to 8 hours after intercourse.  Cervical cap. This is a round, soft, latex or plastic cup that fits over the cervix and must be fitted by a health care provider. The cap can be left in place for up to 48 hours after intercourse.  Sponge. This is a soft, circular piece of polyurethane foam. The sponge has spermicide in it. It is inserted into the vagina after wetting it and before sexual intercourse.  Spermicides. These are chemicals that kill or block sperm from entering the cervix and uterus. They come in the form of creams, jellies, suppositories, foam, or tablets. They do not require a prescription. They   are inserted into the vagina with an applicator before having sexual intercourse. The process must be repeated every time you have sexual intercourse. Intrauterine contraception  Intrauterine device (IUD). This is a T-shaped device that is put in a woman's uterus during  a menstrual period to prevent pregnancy. There are 2 types: ? Copper IUD. This type of IUD is wrapped in copper wire and is placed inside the uterus. Copper makes the uterus and fallopian tubes produce a fluid that kills sperm. It can stay in place for 10 years. ? Hormone IUD. This type of IUD contains the hormone progestin (synthetic progesterone). The hormone thickens the cervical mucus and prevents sperm from entering the uterus, and it also thins the uterine lining to prevent implantation of a fertilized egg. The hormone can weaken or kill the sperm that get into the uterus. It can stay in place for 3-5 years, depending on which type of IUD is used. Permanent methods of contraception  Female tubal ligation. This is when the woman's fallopian tubes are surgically sealed, tied, or blocked to prevent the egg from traveling to the uterus.  Hysteroscopic sterilization. This involves placing a small coil or insert into each fallopian tube. Your doctor uses a technique called hysteroscopy to do the procedure. The device causes scar tissue to form. This results in permanent blockage of the fallopian tubes, so the sperm cannot fertilize the egg. It takes about 3 months after the procedure for the tubes to become blocked. You must use another form of birth control for these 3 months.  Female sterilization. This is when the female has the tubes that carry sperm tied off (vasectomy).This blocks sperm from entering the vagina during sexual intercourse. After the procedure, the man can still ejaculate fluid (semen). Natural planning methods  Natural family planning. This is not having sexual intercourse or using a barrier method (condom, diaphragm, cervical cap) on days the woman could become pregnant.  Calendar method. This is keeping track of the length of each menstrual cycle and identifying when you are fertile.  Ovulation method. This is avoiding sexual intercourse during ovulation.  Symptothermal method.  This is avoiding sexual intercourse during ovulation, using a thermometer and ovulation symptoms.  Post-ovulation method. This is timing sexual intercourse after you have ovulated. Regardless of which type or method of contraception you choose, it is important that you use condoms to protect against the transmission of sexually transmitted infections (STIs). Talk with your health care provider about which form of contraception is most appropriate for you. This information is not intended to replace advice given to you by your health care provider. Make sure you discuss any questions you have with your health care provider. Document Released: 12/19/2004 Document Revised: 05/27/2015 Document Reviewed: 06/13/2012 Elsevier Interactive Patient Education  2017 [employer] Inc[employer]   Tdap Vaccine (Tetanus, Diphtheria and Pertussis): What You Need to Know 1. Why get vaccinated? Tetanus, diphtheria and pertussis are very serious diseases. Tdap vaccine can protect us from these diseases. And, Tdap vaccine given to pregnant women can protect newborn babies against pertussis. TETANUS (Lockjaw) is rare in the [place] today. It causes painful muscle tightening and stiffness, usually all over the body.  It can lead to tightening of muscles in the head and neck so you can't open your mouth, swallow, or sometimes even breathe. Tetanus kills about 1 out of 10 people who are infected even after receiving the best medical care.  DIPHTHERIA is also rare in the [place] today. It can   cause a thick coating to form in the back of the throat.  It can lead to breathing problems, heart failure, paralysis, and death.  PERTUSSIS (Whooping Cough) causes severe coughing spells, which can cause difficulty breathing, vomiting and disturbed sleep.  It can also lead to weight loss, incontinence, and rib fractures. Up to 2 in 100 adolescents and 5 in 100 adults with pertussis are hospitalized or have complications, which  could include pneumonia or death.  These diseases are caused by bacteria. Diphtheria and pertussis are spread from person to person through secretions from coughing or sneezing. Tetanus enters the body through cuts, scratches, or wounds. Before vaccines, as many as 200,000 cases of diphtheria, 200,000 cases of pertussis, and hundreds of cases of tetanus, were reported in the [place] each year. Since vaccination began, reports of cases for tetanus and diphtheria have dropped by about 99% and for pertussis by about 80%. 2. Tdap vaccine Tdap vaccine can protect adolescents and adults from tetanus, diphtheria, and pertussis. One dose of Tdap is routinely given at age 11 or 12. People who did not get Tdap at that age should get it as soon as possible. Tdap is especially important for healthcare professionals and anyone having close contact with a baby younger than 12 months. Pregnant women should get a dose of Tdap during every pregnancy, to protect the newborn from pertussis. Infants are most at risk for severe, life-threatening complications from pertussis. Another vaccine, called Td, protects against tetanus and diphtheria, but not pertussis. A Td booster should be given every 10 years. Tdap may be given as one of these boosters if you have never gotten Tdap before. Tdap may also be given after a severe cut or burn to prevent tetanus infection. Your doctor or the person giving you the vaccine can give you more information. Tdap may safely be given at the same time as other vaccines. 3. Some people should not get this vaccine  A person who has ever had a life-threatening allergic reaction after a previous dose of any diphtheria, tetanus or pertussis containing vaccine, OR has a severe allergy to any part of this vaccine, should not get Tdap vaccine. Tell the person giving the vaccine about any severe allergies.  Anyone who had coma or long repeated seizures within 7 days after a childhood dose of  DTP or DTaP, or a previous dose of Tdap, should not get Tdap, unless a cause other than the vaccine was found. They can still get Td.  Talk to your doctor if you: ? have seizures or another nervous system problem, ? had severe pain or swelling after any vaccine containing diphtheria, tetanus or pertussis, ? ever had a condition called Guillain-Barr Syndrome (GBS), ? aren't feeling well on the day the shot is scheduled. 4. Risks With any medicine, including vaccines, there is a chance of side effects. These are usually mild and go away on their own. Serious reactions are also possible but are rare. Most people who get Tdap vaccine do not have any problems with it. Mild problems following Tdap: (Did not interfere with activities)  Pain where the shot was given (about 3 in 4 adolescents or 2 in 3 adults)  Redness or swelling where the shot was given (about 1 person in 5)  Mild fever of at least 100.4F (up to about 1 in 25 adolescents or 1 in 100 adults)  Headache (about 3 or 4 people in 10)  Tiredness (about 1 person in 3 or 4)  Nausea,   vomiting, diarrhea, stomach ache (up to 1 in 4 adolescents or 1 in 10 adults)  Chills, sore joints (about 1 person in 10)  Body aches (about 1 person in 3 or 4)  Rash, swollen glands (uncommon)  Moderate problems following Tdap: (Interfered with activities, but did not require medical attention)  Pain where the shot was given (up to 1 in 5 or 6)  Redness or swelling where the shot was given (up to about 1 in 16 adolescents or 1 in 12 adults)  Fever over 102F (about 1 in 100 adolescents or 1 in 250 adults)  Headache (about 1 in 7 adolescents or 1 in 10 adults)  Nausea, vomiting, diarrhea, stomach ache (up to 1 or 3 people in 100)  Swelling of the entire arm where the shot was given (up to about 1 in 500).  Severe problems following Tdap: (Unable to perform usual activities; required medical attention)  Swelling, severe pain, bleeding  and redness in the arm where the shot was given (rare).  Problems that could happen after any vaccine:  People sometimes faint after a medical procedure, including vaccination. Sitting or lying down for about 15 minutes can help prevent fainting, and injuries caused by a fall. Tell your doctor if you feel dizzy, or have vision changes or ringing in the ears.  Some people get severe pain in the shoulder and have difficulty moving the arm where a shot was given. This happens very rarely.  Any medication can cause a severe allergic reaction. Such reactions from a vaccine are very rare, estimated at fewer than 1 in a million doses, and would happen within a few minutes to a few hours after the vaccination. As with any medicine, there is a very remote chance of a vaccine causing a serious injury or death. The safety of vaccines is always being monitored. For more information, visit: www.cdc.gov/vaccinesafety/ 5. What if there is a serious problem? What should I look for? Look for anything that concerns you, such as signs of a severe allergic reaction, very high fever, or unusual behavior. Signs of a severe allergic reaction can include hives, swelling of the face and throat, difficulty breathing, a fast heartbeat, dizziness, and weakness. These would usually start a few minutes to a few hours after the vaccination. What should I do?  If you think it is a severe allergic reaction or other emergency that can't wait, call 9-1-1 or get the person to the nearest hospital. Otherwise, call your doctor.  Afterward, the reaction should be reported to the Vaccine Adverse Event Reporting System (VAERS). Your doctor might file this report, or you can do it yourself through the VAERS web site at www.vaers.hhs.gov, or by calling 1-800-822-7967. ? VAERS does not give medical advice. 6. The National Vaccine Injury Compensation Program The National Vaccine Injury Compensation Program (VICP) is a federal program that  was created to compensate people who may have been injured by certain vaccines. Persons who believe they may have been injured by a vaccine can learn about the program and about filing a claim by calling 1-800-338-2382 or visiting the VICP website at www.hrsa.gov/vaccinecompensation. There is a time limit to file a claim for compensation. 7. How can I learn more?  Ask your doctor. He or she can give you the vaccine package insert or suggest other sources of information.  Call your local or state health department.  Contact the Centers for Disease Control and Prevention (CDC): ? Call 1-800-232-4636 (1-800-CDC-INFO) or ? Visit CDC's website   at www.cdc.gov/vaccines CDC Tdap Vaccine VIS (02/25/13) This information is not intended to replace advice given to you by your health care provider. Make sure you discuss any questions you have with your health care provider. Document Released: 06/20/2011 Document Revised: 09/09/2015 Document Reviewed: 09/09/2015 Elsevier Interactive Patient Education  2017 Elsevier Inc.   

## 2016-02-25 NOTE — Progress Notes (Signed)
Pt declined tdap

## 2016-02-25 NOTE — Progress Notes (Signed)
   PRENATAL VISIT NOTE  Subjective:  Teresa Blanchard is a 26 y.o. G3P1011 at 3935w6d being seen today for ongoing prenatal care.  She is currently monitored for the following issues for this high-risk pregnancy and has History of 1 spontaneous abortion; Supervision of high risk pregnancy, antepartum; Prior perinatal loss in second trimester, antepartum; and Gastroesophageal reflux disease on her problem list.  Patient reports no complaints.  Contractions: Not present. Vag. Bleeding: None.  Movement: Present. Denies leaking of fluid.   The following portions of the patient's history were reviewed and updated as appropriate: allergies, current medications, past family history, past medical history, past social history, past surgical history and problem list. Problem list updated.  Objective:   Vitals:   02/25/16 0827  BP: 110/75  Pulse: 72  Weight: 159 lb (72.1 kg)    Fetal Status: Fetal Heart Rate (bpm): 143 Fundal Height: 29 cm Movement: Present  Presentation: Vertex  General:  Alert, oriented and cooperative. Patient is in no acute distress.  Skin: Skin is warm and dry. No rash noted.   Cardiovascular: Normal heart rate noted  Respiratory: Normal respiratory effort, no problems with respiration noted  Abdomen: Soft, gravid, appropriate for gestational age. Pain/Pressure: Present     Pelvic:  Cervical exam deferred        Extremities: Normal range of motion.  Edema: None  Mental Status: Normal mood and affect. Normal behavior. Normal judgment and thought content.   Growth US: 2-8 (51%tile), AFI 24 cm (96%tile)  Assessment and Plan:  Pregnancy: G3P1011 at 7135w6d  1. Normal pregnancy in third trimester  - Glucose Tolerance, 2 Hours w/1 Hour - CBC - HIV antibody (with reflex) - RPR  2. Supervision of high risk pregnancy, antepartum   3. History of 1 spontaneous abortion   Preterm labor symptoms and general obstetric precautions including but not limited to vaginal bleeding,  contractions, leaking of fluid and fetal movement were reviewed in detail with the patient. Please refer to After Visit Summary for other counseling recommendations.  Return in about 2 weeks (around 03/10/2016) for ROB.   Dorathy KinsmanVirginia Raygen Dahm, CNM

## 2016-02-26 LAB — GLUCOSE TOLERANCE, 2 HOURS W/ 1HR
GLUCOSE, FASTING: 68 mg/dL (ref 65–99)
Glucose, 1 hour: 88 mg/dL
Glucose, 2 hour: 65 mg/dL (ref <140)

## 2016-02-26 LAB — RPR

## 2016-02-26 LAB — HIV ANTIBODY (ROUTINE TESTING W REFLEX): HIV: NONREACTIVE

## 2016-03-10 ENCOUNTER — Telehealth: Payer: Self-pay | Admitting: Advanced Practice Midwife

## 2016-03-10 ENCOUNTER — Ambulatory Visit (INDEPENDENT_AMBULATORY_CARE_PROVIDER_SITE_OTHER): Payer: Medicaid Other | Admitting: Advanced Practice Midwife

## 2016-03-10 VITALS — BP 119/72 | HR 64 | Wt 160.0 lb

## 2016-03-10 DIAGNOSIS — Z3483 Encounter for supervision of other normal pregnancy, third trimester: Secondary | ICD-10-CM

## 2016-03-10 DIAGNOSIS — R109 Unspecified abdominal pain: Secondary | ICD-10-CM

## 2016-03-10 DIAGNOSIS — O26893 Other specified pregnancy related conditions, third trimester: Secondary | ICD-10-CM

## 2016-03-10 DIAGNOSIS — Z3493 Encounter for supervision of normal pregnancy, unspecified, third trimester: Secondary | ICD-10-CM

## 2016-03-10 LAB — FETAL FIBRONECTIN: FETAL FIBRONECTIN: NEGATIVE

## 2016-03-10 NOTE — Progress Notes (Signed)
C/O's some vaginal burning.  Denies urinary issues or discharge.  Urine is normal

## 2016-03-10 NOTE — Patient Instructions (Signed)

## 2016-03-10 NOTE — Progress Notes (Signed)
   PRENATAL VISIT NOTE  Subjective:  Teresa Blanchard is a 26 y.o. G3P1011 at 6726w6d being seen today for ongoing prenatal care.  She is currently monitored for the following issues for this low-risk pregnancy and has History of 1 spontaneous abortion; Supervision of high risk pregnancy, antepartum; Prior perinatal loss in second trimester, antepartum; and Gastroesophageal reflux disease on her problem list.  Patient reports intermittent LLQ pain, frequency of urination and increased vaginal discharge. Does not think she's contracting. Denies VB, hematuria, dysuria or GI complaints.  Contractions: Not present. Vag. Bleeding: None.  Movement: Present. Denies leaking of fluid.   The following portions of the patient's history were reviewed and updated as appropriate: allergies, current medications, past family history, past medical history, past social history, past surgical history and problem list. Problem list updated.  Objective:   Vitals:   03/10/16 1050  BP: 119/72  Pulse: 64  Weight: 160 lb (72.6 kg)    Fetal Status: Fetal Heart Rate (bpm): 138 Fundal Height: 31 cm Movement: Present     General:  Alert, oriented and cooperative. Patient is in no acute distress.  Skin: Skin is warm and dry. No rash noted.   Cardiovascular: Normal heart rate noted  Respiratory: Normal respiratory effort, no problems with respiration noted  Abdomen: Soft, gravid, appropriate for gestational age. Pain/Pressure: Present     Pelvic:  Cervical exam performed Dilation: Closed Effacement (%): 0 Station: -3  Extremities: Normal range of motion.  Edema: None  Mental Status: Normal mood and affect. Normal behavior. Normal judgment and thought content.   Assessment and Plan:  Pregnancy: G3P1011 at 6726w6d  1. Encounter for supervision of normal pregnancy in third trimester, unspecified gravidity  - Fetal fibronectin - GC/Chlamydia Probe Amp - Culture, OB Urine  2. Abdominal pain during pregnancy in third  trimester  - Fetal fibronectin - GC/Chlamydia Probe Amp - Culture, OB Urine  Preterm labor symptoms and general obstetric precautions including but not limited to vaginal bleeding, contractions, leaking of fluid and fetal movement were reviewed in detail with the patient. Please refer to After Visit Summary for other counseling recommendations.  F/U 2 weeks   Dorathy KinsmanVirginia Jaxx Huish, [place]

## 2016-03-10 NOTE — Telephone Encounter (Signed)
Informed pt of neg  FFN. F/U as scheduled. PTL precautions.

## 2016-03-12 LAB — CULTURE, OB URINE
COLONY COUNT: NO GROWTH
Organism ID, Bacteria: NO GROWTH

## 2016-03-13 LAB — GC/CHLAMYDIA PROBE AMP
CT PROBE, AMP APTIMA: NOT DETECTED
GC Probe RNA: NOT DETECTED

## 2016-03-20 LAB — OB RESULTS CONSOLE GC/CHLAMYDIA
CHLAMYDIA, DNA PROBE: NEGATIVE
GC PROBE AMP, GENITAL: NEGATIVE

## 2016-03-24 ENCOUNTER — Ambulatory Visit (INDEPENDENT_AMBULATORY_CARE_PROVIDER_SITE_OTHER): Payer: Medicaid Other | Admitting: Advanced Practice Midwife

## 2016-03-24 VITALS — BP 111/71 | HR 81 | Wt 163.0 lb

## 2016-03-24 DIAGNOSIS — O0990 Supervision of high risk pregnancy, unspecified, unspecified trimester: Secondary | ICD-10-CM

## 2016-03-24 DIAGNOSIS — K219 Gastro-esophageal reflux disease without esophagitis: Secondary | ICD-10-CM

## 2016-03-24 DIAGNOSIS — O99613 Diseases of the digestive system complicating pregnancy, third trimester: Secondary | ICD-10-CM

## 2016-03-24 MED ORDER — FAMOTIDINE-CA CARB-MAG HYDROX 10-800-165 MG PO CHEW: 2.0000 | CHEWABLE_TABLET | Freq: Two times a day (BID) | ORAL | 4 refills | Status: DC | PRN

## 2016-03-24 NOTE — Patient Instructions (Signed)

## 2016-03-24 NOTE — Progress Notes (Signed)
Pt states that her Protonix is not working very well.

## 2016-03-24 NOTE — Progress Notes (Signed)
   PRENATAL VISIT NOTE  Subjective:  Teresa Blanchard is a 26 y.o. G3P1011 at 334w6d being seen today for ongoing prenatal care.  She is currently monitored for the following issues for this low-risk pregnancy and has History of 1 spontaneous abortion; Supervision of high risk pregnancy, antepartum; Prior perinatal loss in second trimester, antepartum; and Gastroesophageal reflux disease on her problem list.  Patient reports no complaints.  Contractions: Not present. Vag. Bleeding: None.  Movement: Present. Denies leaking of fluid.   The following portions of the patient's history were reviewed and updated as appropriate: allergies, current medications, past family history, past medical history, past social history, past surgical history and problem list. Problem list updated.  Objective:   Vitals:   03/24/16 0928  BP: 111/71  Pulse: 81  Weight: 163 lb (73.9 kg)    Fetal Status: Fetal Heart Rate (bpm): 130 Fundal Height: 33 cm Movement: Present  Presentation: Vertex  General:  Alert, oriented and cooperative. Patient is in no acute distress.  Skin: Skin is warm and dry. No rash noted.   Cardiovascular: Normal heart rate noted  Respiratory: Normal respiratory effort, no problems with respiration noted  Abdomen: Soft, gravid, appropriate for gestational age. Pain/Pressure: Present     Pelvic:  Cervical exam deferred        Extremities: Normal range of motion.  Edema: Trace  Mental Status: Normal mood and affect. Normal behavior. Normal judgment and thought content.   Assessment and Plan:  Pregnancy: G3P1011 at 4634w6d  1. Gastroesophageal reflux during pregnancy in third trimester, antepartum  - famotidine-calcium carbonate-magnesium hydroxide (PEPCID COMPLETE) [phone] MG chewable tablet; Chew 2 tablets by mouth 2 (two) times daily as needed.  Dispense: 60 tablet; Refill: 4  Preterm labor symptoms and general obstetric precautions including but not limited to vaginal bleeding,  contractions, leaking of fluid and fetal movement were reviewed in detail with the patient. Please refer to After Visit Summary for other counseling recommendations.  F/U 2 weeks   Dorathy KinsmanVirginia Emiline Mancebo, [place]

## 2016-04-07 ENCOUNTER — Ambulatory Visit (INDEPENDENT_AMBULATORY_CARE_PROVIDER_SITE_OTHER): Payer: Medicaid Other | Admitting: Advanced Practice Midwife

## 2016-04-07 DIAGNOSIS — Z3483 Encounter for supervision of other normal pregnancy, third trimester: Secondary | ICD-10-CM

## 2016-04-07 DIAGNOSIS — O0990 Supervision of high risk pregnancy, unspecified, unspecified trimester: Secondary | ICD-10-CM

## 2016-04-07 NOTE — Progress Notes (Signed)
   PRENATAL VISIT NOTE  Subjective:  Teresa Blanchard is a 26 y.o. G3P1011 at [redacted]w[redacted]d being seen today for ongoing prenatal care.  She is currently monitored for the following issues for this low-risk pregnancy and has History of 1 spontaneous abortion; Supervision of high risk pregnancy, antepartum; Prior perinatal loss in second trimester, antepartum; and Gastroesophageal reflux disease on her problem list.  Patient reports occasional contractions.  Contractions: Irregular. Vag. Bleeding: None.  Movement: Present. Denies leaking of fluid.   The following portions of the patient's history were reviewed and updated as appropriate: allergies, current medications, past family history, past medical history, past social history, past surgical history and problem list. Problem list updated.  Objective:   Vitals:   04/07/16 1146  BP: 105/61  Pulse: 86  Weight: 162 lb (73.5 kg)    Fetal Status: Fetal Heart Rate (bpm): 138 Fundal Height: 35 cm Movement: Present  Presentation: Vertex  General:  Alert, oriented and cooperative. Patient is in no acute distress.  Skin: Skin is warm and dry. No rash noted.   Cardiovascular: Normal heart rate noted  Respiratory: Normal respiratory effort, no problems with respiration noted  Abdomen: Soft, gravid, appropriate for gestational age. Pain/Pressure: Present     Pelvic:  Cervical exam declined        Extremities: Normal range of motion.  Edema: None  Mental Status: Normal mood and affect. Normal behavior. Normal judgment and thought content.   Assessment and Plan:  Pregnancy: G3P1011 at [redacted]w[redacted]d  1. Supervision of high risk pregnancy, antepartum   Preterm labor symptoms and general obstetric precautions including but not limited to vaginal bleeding, contractions, leaking of fluid and fetal movement were reviewed in detail with the patient. Please refer to After Visit Summary for other counseling recommendations.  Return in about 1 week (around 04/14/2016)  for ROB.   Dorathy Kinsman, CNM

## 2016-04-07 NOTE — Patient Instructions (Signed)
Breastfeeding Challenges and Solutions  Even though breastfeeding is natural, it can be challenging, especially in the first few weeks after childbirth. It is normal for problems to arise when starting to breastfeed your new baby, even if you have breastfed before. This document provides some solutions to the most common breastfeeding challenges.  Challenges and solutions  Challenge--Cracked or Sore Nipples  Cracked or sore nipples are commonly experienced by breastfeeding mothers. Cracked or sore nipples often are caused by inadequate latching (when your baby's mouth attaches to your breast to breastfeed). Soreness can also happen if your baby is not positioned properly at your breast. Although nipple cracking and soreness are common during the first week after birth, nipple pain is never normal. If you experience nipple cracking or soreness that lasts longer than 1 week or nipple pain, call your health care provider or lactation consultant.  Solution  Ensure proper latching and positioning of your baby by following the steps below:  · Find a comfortable place to sit or lie down, with your neck and back well supported.  · Place a pillow or rolled up blanket under your baby to bring him or her to the level of your breast (if you are seated).  · Make sure that your baby's abdomen is facing your abdomen.  · Gently massage your breast. With your fingertips, massage from your chest wall toward your nipple in a circular motion. This encourages milk flow. You may need to continue this action during the feeding if your milk flows slowly.  · Support your breast with 4 fingers underneath and your thumb above your nipple. Make sure your fingers are well away from your nipple and your baby’s mouth.  · Stroke your baby's lips gently with your finger or nipple.  · When your baby's mouth is open wide enough, quickly bring your baby to your breast, placing your entire nipple and as much of the colored area around your nipple  (areola) as possible into your baby's mouth.  ? More areola should be visible above your baby's upper lip than below the lower lip.  ? Your baby's tongue should be between his or her lower gum and your breast.  · Ensure that your baby's mouth is correctly positioned around your nipple (latched). Your baby's lips should create a seal on your breast and be turned out (everted).  · It is common for your baby to suck for about 2-3 minutes in order to start the flow of breast milk.    Signs that your baby has successfully latched on to your nipple include:  · Quietly tugging or quietly sucking without causing you pain.  · Swallowing heard between every 3-4 sucks.  · Muscle movement above and in front of his or her ears with sucking.    Signs that your baby has not successfully latched on to nipple include:  · Sucking sounds or smacking sounds from your baby while nursing.  · Nipple pain.    Ensure that your breasts stay moisturized and healthy by:  · Avoiding the use of soap on your nipples.  · Wearing a supportive bra. Avoid wearing underwire-style bras or tight bras.  · Air drying your nipples for 3-4 minutes after each feeding.  · Using only cotton bra pads to absorb breast milk leakage. Leaking of breast milk between feedings is normal. Be sure to change the pads if they become soaked with milk.  · Using lanolin on your nipples after nursing. Lanolin helps to maintain your   skin's normal moisture barrier. If you use pure lanolin you do not need to wash it off before feeding your baby again. Pure lanolin is not toxic to your baby. You may also hand express a few drops of breast milk and gently massage that milk into your nipples, allowing it to air dry.    Challenge--Breast Engorgement  Breast engorgement is the overfilling of your breasts with breast milk. In the first few weeks after giving birth, you may experience breast engorgement. Breast engorgement can make your breasts throb and feel hard, tightly stretched,  warm, and tender. Engorgement peaks about the fifth day after you give birth. Having breast engorgement does not mean you have to stop breastfeeding your baby.  Solution  · Breastfeed when you feel the need to reduce the fullness of your breasts or when your baby shows signs of hunger. This is called "breastfeeding on demand."  · Newborns (babies younger than 4 weeks) often breastfeed every 1-3 hours during the day. You may need to awaken your baby to feed if he or she is asleep at a feeding time.  · Do not allow your baby to sleep longer than 5 hours during the night without a feeding.  · Pump or hand express breast milk before breastfeeding to soften your breast, areola, and nipple.  · Apply warm, moist heat (in the shower or with warm water-soaked hand towels) just before feeding or pumping, or massage your breast before or during breastfeeding. This increases circulation and helps your milk to flow.  · Completely empty your breasts when breastfeeding or pumping. Afterward, wear a snug bra (nursing or regular) or tank top for 1-2 days to signal your body to slightly decrease milk production. Only wear snug bras or tank tops to treat engorgement. Tight bras typically should be avoided by breastfeeding mothers. Once engorgement is relieved, return to wearing regular, loose-fitting clothes.  · Apply ice packs to your breasts to lessen the pain from engorgement and relieve swelling, unless the ice is uncomfortable for you.  · Do not delay feedings. Try to relax when it is time to feed your baby. This helps to trigger your "let-down reflex," which releases milk from your breast.  · Ensure your baby is latched on to your breast and positioned properly while breastfeeding.  · Allow your baby to remain at your breast as long as he or she is latched on well and actively sucking. Your baby will let you know when he or she is done breastfeeding by pulling away from your breast or falling asleep.  · Avoid introducing bottles  or pacifiers to your baby in the early weeks of breastfeeding. Wait to introduce these things until after resolving any breastfeeding challenges.  · Try to pump your milk on the same schedule as when your baby would breastfeed if you are returning to work or away from home for an extended period.  · Drink plenty of fluids to avoid dehydration, which can eventually put you at greater risk of breast engorgement.    If you follow these suggestions, your engorgement should improve in 24-48 hours. If you are still experiencing difficulty, call your lactation consultant or health care provider.  Challenge--Plugged Milk Ducts  Plugged milk ducts occur when the duct does not drain milk effectively and becomes swollen. Wearing a tight-fitting nursing bra or having difficulty with latching may cause plugged milk ducts. Not drinking enough water (8-10 c [1.9-2.4 L] per day) can contribute to plugged milk ducts. Once a   duct has become plugged, hard lumps, soreness, and redness may develop in your breast.  Solution  Do not delay feedings. Feed your baby frequently and try to empty your breasts of milk at each feeding. Try breastfeeding from the affected side first so there is a better chance that the milk will drain completely from that breast. Apply warm, moist towels to your breasts for 5-10 minutes before feeding. Alternatively, a hot shower right before breastfeeding can provide the moist heat that can encourage milk flow. Gentle massage of the sore area before and during a feeding may also help. Avoid wearing tight clothing or bras that put pressure on your breasts. Wear bras that offer good support to your breasts, but avoid underwire bras. If you have a plugged milk duct and develop a fever, you need to see your health care provider.  Challenge--Mastitis  Mastitis is inflammation of your breast. It usually is caused by a bacterial infection and can cause flu-like symptoms. You may develop redness in your breast and a  fever. Often when mastitis occurs, your breast becomes firm, warm, and very painful. The most common causes of mastitis are poor latching, ineffective sucking from your baby, consistent pressure on your breast (possibly from wearing a tight-fitting bra or shirt that restricts the milk flow), unusual stress or fatigue, or missed feedings.  Solution  You will be given antibiotic medicine to treat the infection. It is still important to breastfeed frequently to empty your breasts. Continuing to breastfeed while you recover from mastitis will not harm your baby. Make sure your baby is positioned properly during every feeding. Apply moist heat to your breasts for a few minutes before feeding to help the milk flow and to help your breasts empty more easily.  Challenge--Thrush  Thrush is a yeast infection that can form on your nipples, in your breast, or in your baby's mouth. It causes itching, soreness, burning or stabbing pain, and sometimes a rash.  Solution  You will be given a medicated ointment for your nipples, and your baby will be given a liquid medicine for his or her mouth. It is important that you and your baby are treated at the same time because thrush can be passed between you and your baby. Change disposable nursing pads often. Any bras, towels, or clothing that come in contact with infected areas of your body or your baby's body need to be washed in very hot water every day. Wash your hands and your baby's hands often. All pacifiers, bottle nipples, or toys your baby puts in his or her mouth should be boiled once a day for 20 minutes. After 1 week of treatment, discard pacifiers and bottle nipples and buy new ones. All breast pump parts that touch the milk need to be boiled for 20 minutes every day.  Challenge--Low Milk Supply  You may not be producing enough milk if your baby is not gaining the proper amount of weight. Breast milk production is based on a supply-and-demand system. Your milk supply depends  on how frequently and effectively your baby empties your breast.  Solution  The more you breastfeed and pump, the more breast milk you will produce. It is important that your baby empties at least one of your breasts at each feeding. If this is not happening, then use a breast pump or hand express any milk that remains. This will help to drain as much milk as possible at each feeding. It will also signal your body to produce more   milk. If your baby is not emptying your breasts, it may be due to latching, sucking, or positioning problems. If low milk supply continues after addressing these issues, contact your health care provider or a lactation specialist as soon as possible.  Challenge--Inverted or Flat Nipples  Some women have nipples that turn inward instead of protruding outward. Other women have nipples that are flat. Inverted or flat nipples can sometimes make it more difficult for your baby to latch onto your breast.  Solution  You may be given a small device that pulls out inverted nipples. This device should be applied right before your baby is brought to your breast. You can also try using a breast pump for a short time before placing the baby at your breast. The pump can pull your nipple outwards to help your infant latch more easily. The baby's sucking motion will help the inverted nipple protrude as well.  If you have flat nipples, encourage your baby to latch onto your breast and feed frequently in the early days after birth. This will give your baby practice latching on correctly while your breast is still soft. When your milk supply increases, between the second and fifth day after birth and your breasts become full, your baby will have an easier time latching.  Contact a lactation consultant if you still have concerns. She or he can teach you additional techniques to address breastfeeding problems related to nipple shape and position.  Where to find more information:  [employer]:  www.llli.org  This information is not intended to replace advice given to you by your health care provider. Make sure you discuss any questions you have with your health care provider.  Document Released: 06/12/2005 Document Revised: 06/02/2015 Document Reviewed: 06/14/2012  Elsevier Interactive Patient Education © 2017 [employer]

## 2016-04-14 ENCOUNTER — Ambulatory Visit (INDEPENDENT_AMBULATORY_CARE_PROVIDER_SITE_OTHER): Payer: Medicaid Other | Admitting: Advanced Practice Midwife

## 2016-04-14 VITALS — BP 123/84 | HR 97 | Wt 162.0 lb

## 2016-04-14 DIAGNOSIS — Z3A36 36 weeks gestation of pregnancy: Secondary | ICD-10-CM

## 2016-04-14 DIAGNOSIS — Z3483 Encounter for supervision of other normal pregnancy, third trimester: Secondary | ICD-10-CM

## 2016-04-14 LAB — OB RESULTS CONSOLE GBS: STREP GROUP B AG: NEGATIVE

## 2016-04-14 NOTE — Patient Instructions (Signed)
Breastfeeding Challenges and Solutions  Even though breastfeeding is natural, it can be challenging, especially in the first few weeks after childbirth. It is normal for problems to arise when starting to breastfeed your new baby, even if you have breastfed before. This document provides some solutions to the most common breastfeeding challenges.  Challenges and solutions  Challenge--Cracked or Sore Nipples  Cracked or sore nipples are commonly experienced by breastfeeding mothers. Cracked or sore nipples often are caused by inadequate latching (when your baby's mouth attaches to your breast to breastfeed). Soreness can also happen if your baby is not positioned properly at your breast. Although nipple cracking and soreness are common during the first week after birth, nipple pain is never normal. If you experience nipple cracking or soreness that lasts longer than 1 week or nipple pain, call your health care provider or lactation consultant.  Solution  Ensure proper latching and positioning of your baby by following the steps below:  · Find a comfortable place to sit or lie down, with your neck and back well supported.  · Place a pillow or rolled up blanket under your baby to bring him or her to the level of your breast (if you are seated).  · Make sure that your baby's abdomen is facing your abdomen.  · Gently massage your breast. With your fingertips, massage from your chest wall toward your nipple in a circular motion. This encourages milk flow. You may need to continue this action during the feeding if your milk flows slowly.  · Support your breast with 4 fingers underneath and your thumb above your nipple. Make sure your fingers are well away from your nipple and your baby’s mouth.  · Stroke your baby's lips gently with your finger or nipple.  · When your baby's mouth is open wide enough, quickly bring your baby to your breast, placing your entire nipple and as much of the colored area around your nipple  (areola) as possible into your baby's mouth.  ? More areola should be visible above your baby's upper lip than below the lower lip.  ? Your baby's tongue should be between his or her lower gum and your breast.  · Ensure that your baby's mouth is correctly positioned around your nipple (latched). Your baby's lips should create a seal on your breast and be turned out (everted).  · It is common for your baby to suck for about 2-3 minutes in order to start the flow of breast milk.    Signs that your baby has successfully latched on to your nipple include:  · Quietly tugging or quietly sucking without causing you pain.  · Swallowing heard between every 3-4 sucks.  · Muscle movement above and in front of his or her ears with sucking.    Signs that your baby has not successfully latched on to nipple include:  · Sucking sounds or smacking sounds from your baby while nursing.  · Nipple pain.    Ensure that your breasts stay moisturized and healthy by:  · Avoiding the use of soap on your nipples.  · Wearing a supportive bra. Avoid wearing underwire-style bras or tight bras.  · Air drying your nipples for 3-4 minutes after each feeding.  · Using only cotton bra pads to absorb breast milk leakage. Leaking of breast milk between feedings is normal. Be sure to change the pads if they become soaked with milk.  · Using lanolin on your nipples after nursing. Lanolin helps to maintain your   skin's normal moisture barrier. If you use pure lanolin you do not need to wash it off before feeding your baby again. Pure lanolin is not toxic to your baby. You may also hand express a few drops of breast milk and gently massage that milk into your nipples, allowing it to air dry.    Challenge--Breast Engorgement  Breast engorgement is the overfilling of your breasts with breast milk. In the first few weeks after giving birth, you may experience breast engorgement. Breast engorgement can make your breasts throb and feel hard, tightly stretched,  warm, and tender. Engorgement peaks about the fifth day after you give birth. Having breast engorgement does not mean you have to stop breastfeeding your baby.  Solution  · Breastfeed when you feel the need to reduce the fullness of your breasts or when your baby shows signs of hunger. This is called "breastfeeding on demand."  · Newborns (babies younger than 4 weeks) often breastfeed every 1-3 hours during the day. You may need to awaken your baby to feed if he or she is asleep at a feeding time.  · Do not allow your baby to sleep longer than 5 hours during the night without a feeding.  · Pump or hand express breast milk before breastfeeding to soften your breast, areola, and nipple.  · Apply warm, moist heat (in the shower or with warm water-soaked hand towels) just before feeding or pumping, or massage your breast before or during breastfeeding. This increases circulation and helps your milk to flow.  · Completely empty your breasts when breastfeeding or pumping. Afterward, wear a snug bra (nursing or regular) or tank top for 1-2 days to signal your body to slightly decrease milk production. Only wear snug bras or tank tops to treat engorgement. Tight bras typically should be avoided by breastfeeding mothers. Once engorgement is relieved, return to wearing regular, loose-fitting clothes.  · Apply ice packs to your breasts to lessen the pain from engorgement and relieve swelling, unless the ice is uncomfortable for you.  · Do not delay feedings. Try to relax when it is time to feed your baby. This helps to trigger your "let-down reflex," which releases milk from your breast.  · Ensure your baby is latched on to your breast and positioned properly while breastfeeding.  · Allow your baby to remain at your breast as long as he or she is latched on well and actively sucking. Your baby will let you know when he or she is done breastfeeding by pulling away from your breast or falling asleep.  · Avoid introducing bottles  or pacifiers to your baby in the early weeks of breastfeeding. Wait to introduce these things until after resolving any breastfeeding challenges.  · Try to pump your milk on the same schedule as when your baby would breastfeed if you are returning to work or away from home for an extended period.  · Drink plenty of fluids to avoid dehydration, which can eventually put you at greater risk of breast engorgement.    If you follow these suggestions, your engorgement should improve in 24-48 hours. If you are still experiencing difficulty, call your lactation consultant or health care provider.  Challenge--Plugged Milk Ducts  Plugged milk ducts occur when the duct does not drain milk effectively and becomes swollen. Wearing a tight-fitting nursing bra or having difficulty with latching may cause plugged milk ducts. Not drinking enough water (8-10 c [1.9-2.4 L] per day) can contribute to plugged milk ducts. Once a   duct has become plugged, hard lumps, soreness, and redness may develop in your breast.  Solution  Do not delay feedings. Feed your baby frequently and try to empty your breasts of milk at each feeding. Try breastfeeding from the affected side first so there is a better chance that the milk will drain completely from that breast. Apply warm, moist towels to your breasts for 5-10 minutes before feeding. Alternatively, a hot shower right before breastfeeding can provide the moist heat that can encourage milk flow. Gentle massage of the sore area before and during a feeding may also help. Avoid wearing tight clothing or bras that put pressure on your breasts. Wear bras that offer good support to your breasts, but avoid underwire bras. If you have a plugged milk duct and develop a fever, you need to see your health care provider.  Challenge--Mastitis  Mastitis is inflammation of your breast. It usually is caused by a bacterial infection and can cause flu-like symptoms. You may develop redness in your breast and a  fever. Often when mastitis occurs, your breast becomes firm, warm, and very painful. The most common causes of mastitis are poor latching, ineffective sucking from your baby, consistent pressure on your breast (possibly from wearing a tight-fitting bra or shirt that restricts the milk flow), unusual stress or fatigue, or missed feedings.  Solution  You will be given antibiotic medicine to treat the infection. It is still important to breastfeed frequently to empty your breasts. Continuing to breastfeed while you recover from mastitis will not harm your baby. Make sure your baby is positioned properly during every feeding. Apply moist heat to your breasts for a few minutes before feeding to help the milk flow and to help your breasts empty more easily.  Challenge--Thrush  Thrush is a yeast infection that can form on your nipples, in your breast, or in your baby's mouth. It causes itching, soreness, burning or stabbing pain, and sometimes a rash.  Solution  You will be given a medicated ointment for your nipples, and your baby will be given a liquid medicine for his or her mouth. It is important that you and your baby are treated at the same time because thrush can be passed between you and your baby. Change disposable nursing pads often. Any bras, towels, or clothing that come in contact with infected areas of your body or your baby's body need to be washed in very hot water every day. Wash your hands and your baby's hands often. All pacifiers, bottle nipples, or toys your baby puts in his or her mouth should be boiled once a day for 20 minutes. After 1 week of treatment, discard pacifiers and bottle nipples and buy new ones. All breast pump parts that touch the milk need to be boiled for 20 minutes every day.  Challenge--Low Milk Supply  You may not be producing enough milk if your baby is not gaining the proper amount of weight. Breast milk production is based on a supply-and-demand system. Your milk supply depends  on how frequently and effectively your baby empties your breast.  Solution  The more you breastfeed and pump, the more breast milk you will produce. It is important that your baby empties at least one of your breasts at each feeding. If this is not happening, then use a breast pump or hand express any milk that remains. This will help to drain as much milk as possible at each feeding. It will also signal your body to produce more   milk. If your baby is not emptying your breasts, it may be due to latching, sucking, or positioning problems. If low milk supply continues after addressing these issues, contact your health care provider or a lactation specialist as soon as possible.  Challenge--Inverted or Flat Nipples  Some women have nipples that turn inward instead of protruding outward. Other women have nipples that are flat. Inverted or flat nipples can sometimes make it more difficult for your baby to latch onto your breast.  Solution  You may be given a small device that pulls out inverted nipples. This device should be applied right before your baby is brought to your breast. You can also try using a breast pump for a short time before placing the baby at your breast. The pump can pull your nipple outwards to help your infant latch more easily. The baby's sucking motion will help the inverted nipple protrude as well.  If you have flat nipples, encourage your baby to latch onto your breast and feed frequently in the early days after birth. This will give your baby practice latching on correctly while your breast is still soft. When your milk supply increases, between the second and fifth day after birth and your breasts become full, your baby will have an easier time latching.  Contact a lactation consultant if you still have concerns. She or he can teach you additional techniques to address breastfeeding problems related to nipple shape and position.  Where to find more information:  [employer]:  www.llli.org  This information is not intended to replace advice given to you by your health care provider. Make sure you discuss any questions you have with your health care provider.  Document Released: 06/12/2005 Document Revised: 06/02/2015 Document Reviewed: 06/14/2012  Elsevier Interactive Patient Education © 2017 [employer]

## 2016-04-14 NOTE — Progress Notes (Signed)
   PRENATAL VISIT NOTE  Subjective:  Teresa Blanchard is a 26 y.o. G3P1011 at [redacted]w[redacted]d being seen today for ongoing prenatal care.  She is currently monitored for the following issues for this high-risk pregnancy and has History of 1 spontaneous abortion; Supervision of high risk pregnancy, antepartum; Prior perinatal loss in second trimester, antepartum; and Gastroesophageal reflux disease on her problem list.  Patient reports backache and occasional contractions.  Contractions: Irregular. Vag. Bleeding: None.  Movement: Present. Denies leaking of fluid.   The following portions of the patient's history were reviewed and updated as appropriate: allergies, current medications, past family history, past medical history, past social history, past surgical history and problem list. Problem list updated.  Objective:   Vitals:   04/14/16 0836  BP: 123/84  Pulse: 97  Weight: 162 lb (73.5 kg)    Fetal Status: Fetal Heart Rate (bpm): 138 Fundal Height: 35 cm Movement: Present  Presentation: Vertex  General:  Alert, oriented and cooperative. Patient is in no acute distress.  Skin: Skin is warm and dry. No rash noted.   Cardiovascular: Normal heart rate noted  Respiratory: Normal respiratory effort, no problems with respiration noted  Abdomen: Soft, gravid, appropriate for gestational age. Pain/Pressure: Present     Pelvic:  Cervical exam performed Dilation: 1.5 Effacement (%): 0 Station: -3  Extremities: Normal range of motion.  Edema: None  Mental Status: Normal mood and affect. Normal behavior. Normal judgment and thought content.   Assessment and Plan:  Pregnancy: G3P1011 at [redacted]w[redacted]d  1. [redacted] weeks gestation of pregnancy  - Culture, beta strep (group b only)  Term labor symptoms and general obstetric precautions including but not limited to vaginal bleeding, contractions, leaking of fluid and fetal movement were reviewed in detail with the patient. Please refer to After Visit Summary for  other counseling recommendations.  Return in about 1 week (around 04/21/2016) for ROB.   Dorathy Kinsman, CNM

## 2016-04-16 LAB — CULTURE, BETA STREP (GROUP B ONLY)

## 2016-04-21 ENCOUNTER — Ambulatory Visit (INDEPENDENT_AMBULATORY_CARE_PROVIDER_SITE_OTHER): Payer: Medicaid Other | Admitting: Family

## 2016-04-21 VITALS — BP 115/69 | HR 86 | Wt 168.0 lb

## 2016-04-21 DIAGNOSIS — O0993 Supervision of high risk pregnancy, unspecified, third trimester: Secondary | ICD-10-CM

## 2016-04-21 DIAGNOSIS — O0990 Supervision of high risk pregnancy, unspecified, unspecified trimester: Secondary | ICD-10-CM

## 2016-04-21 DIAGNOSIS — K219 Gastro-esophageal reflux disease without esophagitis: Secondary | ICD-10-CM

## 2016-04-21 MED ORDER — PANTOPRAZOLE SODIUM 20 MG PO TBEC: 20.0000 mg | DELAYED_RELEASE_TABLET | Freq: Two times a day (BID) | ORAL | 0 refills | Status: DC

## 2016-04-21 MED ORDER — CIMETIDINE 800 MG PO TABS: 800.0000 mg | ORAL_TABLET | Freq: Two times a day (BID) | ORAL | 2 refills | Status: DC

## 2016-04-21 NOTE — Progress Notes (Signed)
Lt pinched nerve pain

## 2016-04-21 NOTE — Progress Notes (Signed)
   PRENATAL VISIT NOTE  Subjective:  Teresa Blanchard is a 26 y.o. G3P1011 at [redacted]w[redacted]d being seen today for ongoing prenatal care.  She is currently monitored for the following issues for this high-risk pregnancy and has History of 1 spontaneous abortion; Supervision of high risk pregnancy, antepartum; Prior perinatal loss in second trimester, antepartum; and Gastroesophageal reflux disease on her problem list.  Patient reports GERD.  Contractions: Irritability. Vag. Bleeding: None.  Movement: Present. Denies leaking of fluid.   The following portions of the patient's history were reviewed and updated as appropriate: allergies, current medications, past family history, past medical history, past social history, past surgical history and problem list. Problem list updated.  Objective:   Vitals:   04/21/16 0915  BP: 115/69  Pulse: 86  Weight: 168 lb (76.2 kg)    Fetal Status: Fetal Heart Rate (bpm): 136 Fundal Height: 37 cm Movement: Present  Presentation: Vertex  General:  Alert, oriented and cooperative. Patient is in no acute distress.  Skin: Skin is warm and dry. No rash noted.   Cardiovascular: Normal heart rate noted  Respiratory: Normal respiratory effort, no problems with respiration noted  Abdomen: Soft, gravid, appropriate for gestational age. Pain/Pressure: Present     Pelvic:  Cervical exam performed Dilation: 1.5 Effacement (%): 20    Extremities: Normal range of motion.  Edema: None  Mental Status: Normal mood and affect. Normal behavior. Normal judgment and thought content.   Assessment and Plan:  Pregnancy: G3P1011 at [redacted]w[redacted]d  1. Supervision of high risk pregnancy, antepartum - Reviewed GBS and GC/CT results  2. Gastroesophageal reflux disease, esophagitis presence not specified - Increase protonix to BID  Preterm labor symptoms and general obstetric precautions including but not limited to vaginal bleeding, contractions, leaking of fluid and fetal movement were  reviewed in detail with the patient. Please refer to After Visit Summary for other counseling recommendations.  Return in about 1 week (around 04/28/2016).   Eino Farber Kennith Gain, CNM

## 2016-04-25 ENCOUNTER — Inpatient Hospital Stay (HOSPITAL_COMMUNITY)
Admission: AD | Admit: 2016-04-25 | Discharge: 2016-04-26 | Disposition: A | Discharge status: Home / Self Care | Payer: Medicaid Other | Source: Ambulatory Visit | Attending: Family Medicine | Admitting: Family Medicine

## 2016-04-25 DIAGNOSIS — Z3A37 37 weeks gestation of pregnancy: Secondary | ICD-10-CM | POA: Insufficient documentation

## 2016-04-25 DIAGNOSIS — O0990 Supervision of high risk pregnancy, unspecified, unspecified trimester: Secondary | ICD-10-CM

## 2016-04-25 DIAGNOSIS — O09292 Supervision of pregnancy with other poor reproductive or obstetric history, second trimester: Secondary | ICD-10-CM

## 2016-04-25 DIAGNOSIS — O479 False labor, unspecified: Secondary | ICD-10-CM

## 2016-04-25 DIAGNOSIS — O471 False labor at or after 37 completed weeks of gestation: Secondary | ICD-10-CM | POA: Insufficient documentation

## 2016-04-26 ENCOUNTER — Encounter (HOSPITAL_COMMUNITY): Payer: Self-pay

## 2016-04-26 DIAGNOSIS — O471 False labor at or after 37 completed weeks of gestation: Secondary | ICD-10-CM | POA: Diagnosis not present

## 2016-04-26 DIAGNOSIS — Z3A37 37 weeks gestation of pregnancy: Secondary | ICD-10-CM | POA: Diagnosis not present

## 2016-04-26 NOTE — MAU Note (Signed)
I have communicated with [place] CNM and reviewed vital signs:  Vitals:   04/26/16 0020  BP: 122/79  Pulse: 69  Resp: 18  Temp: 98 F (36.7 C)    Vaginal exam:  Dilation: 1.5 Effacement (%): 60 Cervical Position: Posterior Station: -3 Presentation: Vertex Exam by:: Kayren Eaves RN ,   Also reviewed contraction pattern and that non-stress test is reactive.  It has been documented that patient is not contracting with no cervical change since Friday not indicating active labor.  Patient denies any other complaints.  Based on this report provider has given order for discharge.  A discharge order and diagnosis entered by a provider.   Labor discharge instructions reviewed with patient.

## 2016-04-26 NOTE — MAU Note (Signed)
Pt here with c/o contractions for the last few hours, denies bleeding or leaking of fluid.

## 2016-04-26 NOTE — Discharge Instructions (Signed)
Fetal Movement Counts Patient Name: ________________________________________________ Patient Due Date: ____________________ What is a fetal movement count? A fetal movement count is the number of times that you feel your baby move during a certain amount of time. This may also be called a fetal kick count. A fetal movement count is recommended for every pregnant woman. You may be asked to start counting fetal movements as early as week 28 of your pregnancy. Pay attention to when your baby is most active. You may notice your baby's sleep and wake cycles. You may also notice things that make your baby move more. You should do a fetal movement count:  When your baby is normally most active.  At the same time each day. A good time to count movements is while you are resting, after having something to eat and drink. How do I count fetal movements? 1. Find a quiet, comfortable area. Sit, or lie down on your side. 2. Write down the date, the start time and stop time, and the number of movements that you felt between those two times. Take this information with you to your health care visits. 3. For 2 hours, count kicks, flutters, swishes, rolls, and jabs. You should feel at least 10 movements during 2 hours. 4. You may stop counting after you have felt 10 movements. 5. If you do not feel 10 movements in 2 hours, have something to eat and drink. Then, keep resting and counting for 1 hour. If you feel at least 4 movements during that hour, you may stop counting. Contact a health care provider if:  You feel fewer than 4 movements in 2 hours.  Your baby is not moving like he or she usually does. Date: ____________ Start time: ____________ Stop time: ____________ Movements: ____________ Date: ____________ Start time: ____________ Stop time: ____________ Movements: ____________ Date: ____________ Start time: ____________ Stop time: ____________ Movements: ____________ Date: ____________ Start time:  ____________ Stop time: ____________ Movements: ____________ Date: ____________ Start time: ____________ Stop time: ____________ Movements: ____________ Date: ____________ Start time: ____________ Stop time: ____________ Movements: ____________ Date: ____________ Start time: ____________ Stop time: ____________ Movements: ____________ Date: ____________ Start time: ____________ Stop time: ____________ Movements: ____________ Date: ____________ Start time: ____________ Stop time: ____________ Movements: ____________ This information is not intended to replace advice given to you by your health care provider. Make sure you discuss any questions you have with your health care provider. Document Released: 01/18/2006 Document Revised: 08/18/2015 Document Reviewed: 01/28/2015 Elsevier Interactive Patient Education  2017 [employer] Braxton Hicks Contractions Contractions of the uterus can occur throughout pregnancy, but they are not always a sign that you are in labor. You may have practice contractions called Braxton Hicks contractions. These false labor contractions are sometimes confused with true labor. What are Braxton Hicks contractions? Braxton Hicks contractions are tightening movements that occur in the muscles of the uterus before labor. Unlike true labor contractions, these contractions do not result in opening (dilation) and thinning of the cervix. Toward the end of pregnancy (32-34 weeks), Braxton Hicks contractions can happen more often and may become stronger. These contractions are sometimes difficult to tell apart from true labor because they can be very uncomfortable. You should not feel embarrassed if you go to the hospital with false labor. Sometimes, the only way to tell if you are in true labor is for your health care provider to look for changes in the cervix. The health care provider will do a physical exam and may monitor your contractions. If you   are not in true labor, the exam  should show that your cervix is not dilating and your water has not broken. If there are no prenatal problems or other health problems associated with your pregnancy, it is completely safe for you to be sent home with false labor. You may continue to have Braxton Hicks contractions until you go into true labor. How can I tell the difference between true labor and false labor?  Differences  False labor  Contractions last 30-70 seconds.: Contractions are usually shorter and not as strong as true labor contractions.  Contractions become very regular.: Contractions are usually irregular.  Discomfort is usually felt in the top of the uterus, and it spreads to the lower abdomen and low back.: Contractions are often felt in the front of the lower abdomen and in the groin.  Contractions do not go away with walking.: Contractions may go away when you walk around or change positions while lying down.  Contractions usually become more intense and increase in frequency.: Contractions get weaker and are shorter-lasting as time goes on.  The cervix dilates and gets thinner.: The cervix usually does not dilate or become thin. Follow these instructions at home:  Take over-the-counter and prescription medicines only as told by your health care provider.  Keep up with your usual exercises and follow other instructions from your health care provider.  Eat and drink lightly if you think you are going into labor.  If Braxton Hicks contractions are making you uncomfortable:  Change your position from lying down or resting to walking, or change from walking to resting.  Sit and rest in a tub of warm water.  Drink enough fluid to keep your urine clear or pale yellow. Dehydration may cause these contractions.  Do slow and deep breathing several times an hour.  Keep all follow-up prenatal visits as told by your health care provider. This is important. Contact a health care provider if:  You have a  fever.  You have continuous pain in your abdomen. Get help right away if:  Your contractions become stronger, more regular, and closer together.  You have fluid leaking or gushing from your vagina.  You pass blood-tinged mucus (bloody show).  You have bleeding from your vagina.  You have low back pain that you never had before.  You feel your baby's head pushing down and causing pelvic pressure.  Your baby is not moving inside you as much as it used to. Summary  Contractions that occur before labor are called Braxton Hicks contractions, false labor, or practice contractions.  Braxton Hicks contractions are usually shorter, weaker, farther apart, and less regular than true labor contractions. True labor contractions usually become progressively stronger and regular and they become more frequent.  Manage discomfort from Braxton Hicks contractions by changing position, resting in a warm bath, drinking plenty of water, or practicing deep breathing. This information is not intended to replace advice given to you by your health care provider. Make sure you discuss any questions you have with your health care provider. Document Released: 12/19/2004 Document Revised: 11/08/2015 Document Reviewed: 11/08/2015 Elsevier Interactive Patient Education  2017 [employer]  

## 2016-04-28 ENCOUNTER — Ambulatory Visit (INDEPENDENT_AMBULATORY_CARE_PROVIDER_SITE_OTHER): Payer: Medicaid Other | Admitting: Obstetrics and Gynecology

## 2016-04-28 VITALS — BP 110/78 | HR 88 | Wt 165.0 lb

## 2016-04-28 DIAGNOSIS — O0990 Supervision of high risk pregnancy, unspecified, unspecified trimester: Secondary | ICD-10-CM

## 2016-04-28 DIAGNOSIS — O0993 Supervision of high risk pregnancy, unspecified, third trimester: Secondary | ICD-10-CM

## 2016-04-28 NOTE — Patient Instructions (Signed)
Fetal Movement Counts Patient Name: ________________________________________________ Patient Due Date: ____________________ What is a fetal movement count? A fetal movement count is the number of times that you feel your baby move during a certain amount of time. This may also be called a fetal kick count. A fetal movement count is recommended for every pregnant woman. You may be asked to start counting fetal movements as early as week 28 of your pregnancy. Pay attention to when your baby is most active. You may notice your baby's sleep and wake cycles. You may also notice things that make your baby move more. You should do a fetal movement count:  When your baby is normally most active.  At the same time each day. A good time to count movements is while you are resting, after having something to eat and drink. How do I count fetal movements? 1. Find a quiet, comfortable area. Sit, or lie down on your side. 2. Write down the date, the start time and stop time, and the number of movements that you felt between those two times. Take this information with you to your health care visits. 3. For 2 hours, count kicks, flutters, swishes, rolls, and jabs. You should feel at least 10 movements during 2 hours. 4. You may stop counting after you have felt 10 movements. 5. If you do not feel 10 movements in 2 hours, have something to eat and drink. Then, keep resting and counting for 1 hour. If you feel at least 4 movements during that hour, you may stop counting. Contact a health care provider if:  You feel fewer than 4 movements in 2 hours.  Your baby is not moving like he or she usually does. Date: ____________ Start time: ____________ Stop time: ____________ Movements: ____________ Date: ____________ Start time: ____________ Stop time: ____________ Movements: ____________ Date: ____________ Start time: ____________ Stop time: ____________ Movements: ____________ Date: ____________ Start time:  ____________ Stop time: ____________ Movements: ____________ Date: ____________ Start time: ____________ Stop time: ____________ Movements: ____________ Date: ____________ Start time: ____________ Stop time: ____________ Movements: ____________ Date: ____________ Start time: ____________ Stop time: ____________ Movements: ____________ Date: ____________ Start time: ____________ Stop time: ____________ Movements: ____________ Date: ____________ Start time: ____________ Stop time: ____________ Movements: ____________ This information is not intended to replace advice given to you by your health care provider. Make sure you discuss any questions you have with your health care provider. Document Released: 01/18/2006 Document Revised: 08/18/2015 Document Reviewed: 01/28/2015 Elsevier Interactive Patient Education  2017 [employer] Braxton Hicks Contractions Contractions of the uterus can occur throughout pregnancy, but they are not always a sign that you are in labor. You may have practice contractions called Braxton Hicks contractions. These false labor contractions are sometimes confused with true labor. What are Braxton Hicks contractions? Braxton Hicks contractions are tightening movements that occur in the muscles of the uterus before labor. Unlike true labor contractions, these contractions do not result in opening (dilation) and thinning of the cervix. Toward the end of pregnancy (32-34 weeks), Braxton Hicks contractions can happen more often and may become stronger. These contractions are sometimes difficult to tell apart from true labor because they can be very uncomfortable. You should not feel embarrassed if you go to the hospital with false labor. Sometimes, the only way to tell if you are in true labor is for your health care provider to look for changes in the cervix. The health care provider will do a physical exam and may monitor your contractions. If you   are not in true labor, the exam  should show that your cervix is not dilating and your water has not broken. If there are no prenatal problems or other health problems associated with your pregnancy, it is completely safe for you to be sent home with false labor. You may continue to have Braxton Hicks contractions until you go into true labor. How can I tell the difference between true labor and false labor?  Differences  False labor  Contractions last 30-70 seconds.: Contractions are usually shorter and not as strong as true labor contractions.  Contractions become very regular.: Contractions are usually irregular.  Discomfort is usually felt in the top of the uterus, and it spreads to the lower abdomen and low back.: Contractions are often felt in the front of the lower abdomen and in the groin.  Contractions do not go away with walking.: Contractions may go away when you walk around or change positions while lying down.  Contractions usually become more intense and increase in frequency.: Contractions get weaker and are shorter-lasting as time goes on.  The cervix dilates and gets thinner.: The cervix usually does not dilate or become thin. Follow these instructions at home:  Take over-the-counter and prescription medicines only as told by your health care provider.  Keep up with your usual exercises and follow other instructions from your health care provider.  Eat and drink lightly if you think you are going into labor.  If Braxton Hicks contractions are making you uncomfortable:  Change your position from lying down or resting to walking, or change from walking to resting.  Sit and rest in a tub of warm water.  Drink enough fluid to keep your urine clear or pale yellow. Dehydration may cause these contractions.  Do slow and deep breathing several times an hour.  Keep all follow-up prenatal visits as told by your health care provider. This is important. Contact a health care provider if:  You have a  fever.  You have continuous pain in your abdomen. Get help right away if:  Your contractions become stronger, more regular, and closer together.  You have fluid leaking or gushing from your vagina.  You pass blood-tinged mucus (bloody show).  You have bleeding from your vagina.  You have low back pain that you never had before.  You feel your baby's head pushing down and causing pelvic pressure.  Your baby is not moving inside you as much as it used to. Summary  Contractions that occur before labor are called Braxton Hicks contractions, false labor, or practice contractions.  Braxton Hicks contractions are usually shorter, weaker, farther apart, and less regular than true labor contractions. True labor contractions usually become progressively stronger and regular and they become more frequent.  Manage discomfort from Braxton Hicks contractions by changing position, resting in a warm bath, drinking plenty of water, or practicing deep breathing. This information is not intended to replace advice given to you by your health care provider. Make sure you discuss any questions you have with your health care provider. Document Released: 12/19/2004 Document Revised: 11/08/2015 Document Reviewed: 11/08/2015 Elsevier Interactive Patient Education  2017 [employer]  

## 2016-04-28 NOTE — Progress Notes (Signed)
   PRENATAL VISIT NOTE  Subjective:  Teresa Blanchard is a 26 y.o. G3P1011 at [redacted]w[redacted]d being seen today for ongoing prenatal care.  She is currently monitored for the following issues for this high-risk pregnancy and has History of 1 spontaneous abortion; Supervision of high risk pregnancy, antepartum; Prior perinatal loss in second trimester, antepartum; and Gastroesophageal reflux disease on her problem list.  Patient reports occasional contractions.  Contractions: Irritability. Vag. Bleeding: None.  Movement: Present. Denies leaking of fluid. She was seen at [facility] for labor evaluation Tuesday 04/25/2016. She requests a membrane stripping today.  The following portions of the patient's history were reviewed and updated as appropriate: allergies, current medications, past family history, past medical history, past social history, past surgical history and problem list. Problem list updated.  Objective:   Vitals:   04/28/16 0926  BP: 110/78  Pulse: 88  Weight: 165 lb (74.8 kg)    Fetal Status: Fetal Heart Rate (bpm): 139 Fundal Height: 36 cm Movement: Present  Presentation: Vertex  General:  Alert, oriented and cooperative. Patient is in no acute distress.  Skin: Skin is warm and dry. No rash noted.   Cardiovascular: Normal heart rate noted  Respiratory: Normal respiratory effort, no problems with respiration noted  Abdomen: Soft, gravid, appropriate for gestational age. Pain/Pressure: Present     Pelvic:  Cervical exam performed Dilation: 1.5 Effacement (%): 70 Station: -3  Extremities: Normal range of motion.  Edema: None  Mental Status: Normal mood and affect. Normal behavior. Normal judgment and thought content.   Assessment and Plan:  Pregnancy: G3P1011 at [redacted]w[redacted]d 1. Supervision of high risk pregnancy, antepartum - Advised against membrane stripping at this time d/t cervical dilation unchanged x 3 wks and fetal head high  Term labor symptoms and general obstetric  precautions including but not limited to vaginal bleeding, contractions, leaking of fluid and fetal movement were reviewed in detail with the patient. Please refer to After Visit Summary for other counseling recommendations.  Return in about 1 week (around 05/05/2016) for Return OB visit.   Raelyn Mora, CNM

## 2016-05-05 ENCOUNTER — Ambulatory Visit (INDEPENDENT_AMBULATORY_CARE_PROVIDER_SITE_OTHER): Payer: Medicaid Other | Admitting: Advanced Practice Midwife

## 2016-05-05 VITALS — BP 117/69 | HR 71 | Wt 170.0 lb

## 2016-05-05 DIAGNOSIS — O0993 Supervision of high risk pregnancy, unspecified, third trimester: Secondary | ICD-10-CM

## 2016-05-05 DIAGNOSIS — O0990 Supervision of high risk pregnancy, unspecified, unspecified trimester: Secondary | ICD-10-CM

## 2016-05-05 NOTE — Progress Notes (Signed)
   PRENATAL VISIT NOTE  Subjective:  Teresa Blanchard is a 26 y.o. G3P1011 at 7863w6d being seen today for ongoing prenatal care.  She is currently monitored for the following issues for this low-risk pregnancy and has History of 1 spontaneous abortion; Supervision of high risk pregnancy, antepartum; Prior perinatal loss in second trimester, antepartum; and Gastroesophageal reflux disease on her problem list.  Patient reports no leaking and occasional contractions.  Contractions: Irritability. Vag. Bleeding: None.  Movement: Present. Denies leaking of fluid.   The following portions of the patient's history were reviewed and updated as appropriate: allergies, current medications, past family history, past medical history, past social history, past surgical history and problem list. Problem list updated.  Objective:   Vitals:   05/05/16 0928  BP: 117/69  Pulse: 71  Weight: 170 lb (77.1 kg)    Fetal Status: Fetal Heart Rate (bpm): 126   Movement: Present     General:  Alert, oriented and cooperative. Patient is in no acute distress.  Skin: Skin is warm and dry. No rash noted.   Cardiovascular: Normal heart rate noted  Respiratory: Normal respiratory effort, no problems with respiration noted  Abdomen: Soft, gravid, appropriate for gestational age. Pain/Pressure: Present     Pelvic:  Cervical exam performed      1-2/70/-3/vertex      Membranes swept  Extremities: Normal range of motion.  Edema: None  Mental Status: Normal mood and affect. Normal behavior. Normal judgment and thought content.   Assessment and Plan:  Pregnancy: G3P1011 at 1663w6d  There are no diagnoses linked to this encounter. Term labor symptoms and general obstetric precautions including but not limited to vaginal bleeding, contractions, leaking of fluid and fetal movement were reviewed in detail with the patient. Please refer to After Visit Summary for other counseling recommendations.  RTO 1 week  Aviva SignsMarie L Denijah Karrer,  CNM

## 2016-05-05 NOTE — Patient Instructions (Signed)
Third Trimester of Pregnancy The third trimester is from week 28 through week 40 (months 7 through 9). The third trimester is a time when the unborn baby (fetus) is growing rapidly. At the end of the ninth month, the fetus is about 20 inches in length and weighs 6-10 pounds. Body changes during your third trimester Your body will continue to go through many changes during pregnancy. The changes vary from woman to woman. During the third trimester:  Your weight will continue to increase. You can expect to gain 25-35 pounds (11-16 kg) by the end of the pregnancy.  You may begin to get stretch marks on your hips, abdomen, and breasts.  You may urinate more often because the fetus is moving lower into your pelvis and pressing on your bladder.  You may develop or continue to have heartburn. This is caused by increased hormones that slow down muscles in the digestive tract.  You may develop or continue to have constipation because increased hormones slow digestion and cause the muscles that push waste through your intestines to relax.  You may develop hemorrhoids. These are swollen veins (varicose veins) in the rectum that can itch or be painful.  You may develop swollen, bulging veins (varicose veins) in your legs.  You may have increased body aches in the pelvis, back, or thighs. This is due to weight gain and increased hormones that are relaxing your joints.  You may have changes in your hair. These can include thickening of your hair, rapid growth, and changes in texture. Some women also have hair loss during or after pregnancy, or hair that feels dry or thin. Your hair will most likely return to normal after your baby is born.  Your breasts will continue to grow and they will continue to become tender. A yellow fluid (colostrum) may leak from your breasts. This is the first milk you are producing for your baby.  Your belly button may stick out.  You may notice more swelling in your hands,  face, or ankles.  You may have increased tingling or numbness in your hands, arms, and legs. The skin on your belly may also feel numb.  You may feel short of breath because of your expanding uterus.  You may have more problems sleeping. This can be caused by the size of your belly, increased need to urinate, and an increase in your body's metabolism.  You may notice the fetus "dropping," or moving lower in your abdomen (lightening).  You may have increased vaginal discharge.  You may notice your joints feel loose and you may have pain around your pelvic bone.  What to expect at prenatal visits You will have prenatal exams every 2 weeks until week 36. Then you will have weekly prenatal exams. During a routine prenatal visit:  You will be weighed to make sure you and the baby are growing normally.  Your blood pressure will be taken.  Your abdomen will be measured to track your baby's growth.  The fetal heartbeat will be listened to.  Any test results from the previous visit will be discussed.  You may have a cervical check near your due date to see if your cervix has softened or thinned (effaced).  You will be tested for Group B streptococcus. This happens between 35 and 37 weeks.  Your health care provider may ask you:  What your birth plan is.  How you are feeling.  If you are feeling the baby move.  If you have had   any abnormal symptoms, such as leaking fluid, bleeding, severe headaches, or abdominal cramping.  If you are using any tobacco products, including cigarettes, chewing tobacco, and electronic cigarettes.  If you have any questions.  Other tests or screenings that may be performed during your third trimester include:  Blood tests that check for low iron levels (anemia).  Fetal testing to check the health, activity level, and growth of the fetus. Testing is done if you have certain medical conditions or if there are problems during the  pregnancy.  Nonstress test (NST). This test checks the health of your baby to make sure there are no signs of problems, such as the baby not getting enough oxygen. During this test, a belt is placed around your belly. The baby is made to move, and its heart rate is monitored during movement.  What is false labor? False labor is a condition in which you feel small, irregular tightenings of the muscles in the womb (contractions) that usually go away with rest, changing position, or drinking water. These are called Braxton Hicks contractions. Contractions may last for hours, days, or even weeks before true labor sets in. If contractions come at regular intervals, become more frequent, increase in intensity, or become painful, you should see your health care provider. What are the signs of labor?  Abdominal cramps.  Regular contractions that start at 10 minutes apart and become stronger and more frequent with time.  Contractions that start on the top of the uterus and spread down to the lower abdomen and back.  Increased pelvic pressure and dull back pain.  A watery or bloody mucus discharge that comes from the vagina.  Leaking of amniotic fluid. This is also known as your "water breaking." It could be a slow trickle or a gush. Let your health care provider know if it has a color or strange odor. If you have any of these signs, call your health care provider right away, even if it is before your due date. Follow these instructions at home: Medicines  Follow your health care provider's instructions regarding medicine use. Specific medicines may be either safe or unsafe to take during pregnancy.  Take a prenatal vitamin that contains at least 600 micrograms (mcg) of folic acid.  If you develop constipation, try taking a stool softener if your health care provider approves. Eating and drinking  Eat a balanced diet that includes fresh fruits and vegetables, whole grains, good sources of protein  such as meat, eggs, or tofu, and low-fat dairy. Your health care provider will help you determine the amount of weight gain that is right for you.  Avoid raw meat and uncooked cheese. These carry germs that can cause birth defects in the baby.  If you have low calcium intake from food, talk to your health care provider about whether you should take a daily calcium supplement.  Eat four or five small meals rather than three large meals a day.  Limit foods that are high in fat and processed sugars, such as fried and sweet foods.  To prevent constipation: ? Drink enough fluid to keep your urine clear or pale yellow. ? Eat foods that are high in fiber, such as fresh fruits and vegetables, whole grains, and beans. Activity  Exercise only as directed by your health care provider. Most women can continue their usual exercise routine during pregnancy. Try to exercise for 30 minutes at least 5 days a week. Stop exercising if you experience uterine contractions.  Avoid heavy   lifting.  Do not exercise in extreme heat or humidity, or at high altitudes.  Wear low-heel, comfortable shoes.  Practice good posture.  You may continue to have sex unless your health care provider tells you otherwise. Relieving pain and discomfort  Take frequent breaks and rest with your legs elevated if you have leg cramps or low back pain.  Take warm sitz baths to soothe any pain or discomfort caused by hemorrhoids. Use hemorrhoid cream if your health care provider approves.  Wear a good support bra to prevent discomfort from breast tenderness.  If you develop varicose veins: ? Wear support pantyhose or compression stockings as told by your healthcare provider. ? Elevate your feet for 15 minutes, 3-4 times a day. Prenatal care  Write down your questions. Take them to your prenatal visits.  Keep all your prenatal visits as told by your health care provider. This is important. Safety  Wear your seat belt at  all times when driving.  Make a list of emergency phone numbers, including numbers for family, friends, the hospital, and police and fire departments. General instructions  Avoid cat litter boxes and soil used by cats. These carry germs that can cause birth defects in the baby. If you have a cat, ask someone to clean the litter box for you.  Do not travel far distances unless it is absolutely necessary and only with the approval of your health care provider.  Do not use hot tubs, steam rooms, or saunas.  Do not drink alcohol.  Do not use any products that contain nicotine or tobacco, such as cigarettes and e-cigarettes. If you need help quitting, ask your health care provider.  Do not use any medicinal herbs or unprescribed drugs. These chemicals affect the formation and growth of the baby.  Do not douche or use tampons or scented sanitary pads.  Do not cross your legs for long periods of time.  To prepare for the arrival of your baby: ? Take prenatal classes to understand, practice, and ask questions about labor and delivery. ? Make a trial run to the hospital. ? Visit the hospital and tour the maternity area. ? Arrange for maternity or paternity leave through employers. ? Arrange for family and friends to take care of pets while you are in the hospital. ? Purchase a rear-facing car seat and make sure you know how to install it in your car. ? Pack your hospital bag. ? Prepare the baby's nursery. Make sure to remove all pillows and stuffed animals from the baby's crib to prevent suffocation.  Visit your dentist if you have not gone during your pregnancy. Use a soft toothbrush to brush your teeth and be gentle when you floss. Contact a health care provider if:  You are unsure if you are in labor or if your water has broken.  You become dizzy.  You have mild pelvic cramps, pelvic pressure, or nagging pain in your abdominal area.  You have lower back pain.  You have persistent  nausea, vomiting, or diarrhea.  You have an unusual or bad smelling vaginal discharge.  You have pain when you urinate. Get help right away if:  Your water breaks before 37 weeks.  You have regular contractions less than 5 minutes apart before 37 weeks.  You have a fever.  You are leaking fluid from your vagina.  You have spotting or bleeding from your vagina.  You have severe abdominal pain or cramping.  You have rapid weight loss or weight gain.    You have shortness of breath with chest pain.  You notice sudden or extreme swelling of your face, hands, ankles, feet, or legs.  Your baby makes fewer than 10 movements in 2 hours.  You have severe headaches that do not go away when you take medicine.  You have vision changes. Summary  The third trimester is from week 28 through week 40, months 7 through 9. The third trimester is a time when the unborn baby (fetus) is growing rapidly.  During the third trimester, your discomfort may increase as you and your baby continue to gain weight. You may have abdominal, leg, and back pain, sleeping problems, and an increased need to urinate.  During the third trimester your breasts will keep growing and they will continue to become tender. A yellow fluid (colostrum) may leak from your breasts. This is the first milk you are producing for your baby.  False labor is a condition in which you feel small, irregular tightenings of the muscles in the womb (contractions) that eventually go away. These are called Braxton Hicks contractions. Contractions may last for hours, days, or even weeks before true labor sets in.  Signs of labor can include: abdominal cramps; regular contractions that start at 10 minutes apart and become stronger and more frequent with time; watery or bloody mucus discharge that comes from the vagina; increased pelvic pressure and dull back pain; and leaking of amniotic fluid. This information is not intended to replace advice  given to you by your health care provider. Make sure you discuss any questions you have with your health care provider. Document Released: 12/13/2000 Document Revised: 05/27/2015 Document Reviewed: 02/20/2012 Elsevier Interactive Patient Education  2017 [employer]  

## 2016-05-09 ENCOUNTER — Inpatient Hospital Stay (HOSPITAL_COMMUNITY)
Admission: AD | Admit: 2016-05-09 | Discharge: 2016-05-09 | Disposition: A | Discharge status: Home / Self Care | Payer: Medicaid Other | Source: Ambulatory Visit | Attending: Obstetrics & Gynecology | Admitting: Obstetrics & Gynecology

## 2016-05-09 ENCOUNTER — Encounter (HOSPITAL_COMMUNITY): Payer: Self-pay

## 2016-05-09 DIAGNOSIS — O479 False labor, unspecified: Secondary | ICD-10-CM

## 2016-05-09 DIAGNOSIS — Z3A00 Weeks of gestation of pregnancy not specified: Secondary | ICD-10-CM | POA: Diagnosis not present

## 2016-05-09 MED ORDER — OXYCODONE-ACETAMINOPHEN 5-325 MG PO TABS: 2.0000 | ORAL_TABLET | Freq: Once | ORAL | Status: DC

## 2016-05-09 NOTE — Discharge Instructions (Signed)
Braxton Hicks Contractions °Contractions of the uterus can occur throughout pregnancy, but they are not always a sign that you are in labor. You may have practice contractions called Braxton Hicks contractions. These false labor contractions are sometimes confused with true labor. °What are Braxton Hicks contractions? °Braxton Hicks contractions are tightening movements that occur in the muscles of the uterus before labor. Unlike true labor contractions, these contractions do not result in opening (dilation) and thinning of the cervix. Toward the end of pregnancy (32-34 weeks), Braxton Hicks contractions can happen more often and may become stronger. These contractions are sometimes difficult to tell apart from true labor because they can be very uncomfortable. You should not feel embarrassed if you go to the hospital with false labor. °Sometimes, the only way to tell if you are in true labor is for your health care provider to look for changes in the cervix. The health care provider will do a physical exam and may monitor your contractions. If you are not in true labor, the exam should show that your cervix is not dilating and your water has not broken. °If there are no prenatal problems or other health problems associated with your pregnancy, it is completely safe for you to be sent home with false labor. You may continue to have Braxton Hicks contractions until you go into true labor. °How can I tell the difference between true labor and false labor? °· Differences °¨ False labor °¨ Contractions last 30-70 seconds.: Contractions are usually shorter and not as strong as true labor contractions. °¨ Contractions become very regular.: Contractions are usually irregular. °¨ Discomfort is usually felt in the top of the uterus, and it spreads to the lower abdomen and low back.: Contractions are often felt in the front of the lower abdomen and in the groin. °¨ Contractions do not go away with walking.: Contractions may  go away when you walk around or change positions while lying down. °¨ Contractions usually become more intense and increase in frequency.: Contractions get weaker and are shorter-lasting as time goes on. °¨ The cervix dilates and gets thinner.: The cervix usually does not dilate or become thin. °Follow these instructions at home: °¨ Take over-the-counter and prescription medicines only as told by your health care provider. °¨ Keep up with your usual exercises and follow other instructions from your health care provider. °¨ Eat and drink lightly if you think you are going into labor. °¨ If Braxton Hicks contractions are making you uncomfortable: °¨ Change your position from lying down or resting to walking, or change from walking to resting. °¨ Sit and rest in a tub of warm water. °¨ Drink enough fluid to keep your urine clear or pale yellow. Dehydration may cause these contractions. °¨ Do slow and deep breathing several times an hour. °¨ Keep all follow-up prenatal visits as told by your health care provider. This is important. °Contact a health care provider if: °¨ You have a fever. °¨ You have continuous pain in your abdomen. °Get help right away if: °¨ Your contractions become stronger, more regular, and closer together. °¨ You have fluid leaking or gushing from your vagina. °¨ You pass blood-tinged mucus (bloody show). °¨ You have bleeding from your vagina. °¨ You have low back pain that you never had before. °¨ You feel your baby’s head pushing down and causing pelvic pressure. °¨ Your baby is not moving inside you as much as it used to. °Summary °¨ Contractions that occur before labor are   called Braxton Hicks contractions, false labor, or practice contractions. °¨ Braxton Hicks contractions are usually shorter, weaker, farther apart, and less regular than true labor contractions. True labor contractions usually become progressively stronger and regular and they become more frequent. °¨ Manage discomfort from  Braxton Hicks contractions by changing position, resting in a warm bath, drinking plenty of water, or practicing deep breathing. °This information is not intended to replace advice given to you by your health care provider. Make sure you discuss any questions you have with your health care provider. °Document Released: 12/19/2004 Document Revised: 11/08/2015 Document Reviewed: 11/08/2015 °[employer] Interactive Patient Education © 2017 [employer] °Fetal Movement Counts °Patient Name: ________________________________________________ Patient Due Date: ____________________ °What is a fetal movement count? °A fetal movement count is the number of times that you feel your baby move during a certain amount of time. This may also be called a fetal kick count. A fetal movement count is recommended for every pregnant woman. You may be asked to start counting fetal movements as early as week 28 of your pregnancy. °Pay attention to when your baby is most active. You may notice your baby's sleep and wake cycles. You may also notice things that make your baby move more. You should do a fetal movement count: °· When your baby is normally most active. °· At the same time each day. °A good time to count movements is while you are resting, after having something to eat and drink. °How do I count fetal movements? °1. Find a quiet, comfortable area. Sit, or lie down on your side. °2. Write down the date, the start time and stop time, and the number of movements that you felt between those two times. Take this information with you to your health care visits. °3. For 2 hours, count kicks, flutters, swishes, rolls, and jabs. You should feel at least 10 movements during 2 hours. °4. You may stop counting after you have felt 10 movements. °5. If you do not feel 10 movements in 2 hours, have something to eat and drink. Then, keep resting and counting for 1 hour. If you feel at least 4 movements during that hour, you may stop  counting. °Contact a health care provider if: °· You feel fewer than 4 movements in 2 hours. °· Your baby is not moving like he or she usually does. °Date: ____________ Start time: ____________ Stop time: ____________ Movements: ____________ °Date: ____________ Start time: ____________ Stop time: ____________ Movements: ____________ °Date: ____________ Start time: ____________ Stop time: ____________ Movements: ____________ °Date: ____________ Start time: ____________ Stop time: ____________ Movements: ____________ °Date: ____________ Start time: ____________ Stop time: ____________ Movements: ____________ °Date: ____________ Start time: ____________ Stop time: ____________ Movements: ____________ °Date: ____________ Start time: ____________ Stop time: ____________ Movements: ____________ °Date: ____________ Start time: ____________ Stop time: ____________ Movements: ____________ °Date: ____________ Start time: ____________ Stop time: ____________ Movements: ____________ °This information is not intended to replace advice given to you by your health care provider. Make sure you discuss any questions you have with your health care provider. °Document Released: 01/18/2006 Document Revised: 08/18/2015 Document Reviewed: 01/28/2015 °[employer]vier Interactive Patient Education © 2017 [employer] ° °

## 2016-05-09 NOTE — MAU Note (Signed)
I have communicated with Teresa ShellerHeather Hogan CNM and reviewed vital signs:  Vitals:   05/09/16 0520  BP: 127/77  Pulse: 60  Resp: 16  Temp: 98.2 F (36.8 C)    Vaginal exam:  Dilation: 1.5 Effacement (%): 70 Cervical Position: Posterior Station: -3 Presentation: Vertex Exam by:: [name] RN,   Also reviewed contraction pattern and that non-stress test is reactive.  It has been documented that patient had 2 contractions over 45 minute period with no cervical change indicating patient is not in active labor.  Patient denies any other complaints.  Based on this report provider has given order for discharge. A discharge order and diagnosis entered by a provider.   Labor discharge instructions reviewed with patient.

## 2016-05-09 NOTE — MAU Note (Signed)
Pt reports contractions every 10-20 mins. Pt denies LOF or vaginal bleeding. Reports good fetal movement. Cervix was 1.5 on last exam.

## 2016-05-11 ENCOUNTER — Inpatient Hospital Stay (HOSPITAL_COMMUNITY)
Admission: AD | Admit: 2016-05-11 | Discharge: 2016-05-11 | Disposition: A | Discharge status: Home / Self Care | Payer: Medicaid Other | Source: Ambulatory Visit | Attending: Obstetrics & Gynecology | Admitting: Obstetrics & Gynecology

## 2016-05-11 DIAGNOSIS — O09292 Supervision of pregnancy with other poor reproductive or obstetric history, second trimester: Secondary | ICD-10-CM

## 2016-05-11 DIAGNOSIS — Z3483 Encounter for supervision of other normal pregnancy, third trimester: Secondary | ICD-10-CM | POA: Diagnosis not present

## 2016-05-11 DIAGNOSIS — O479 False labor, unspecified: Secondary | ICD-10-CM

## 2016-05-11 DIAGNOSIS — O0990 Supervision of high risk pregnancy, unspecified, unspecified trimester: Secondary | ICD-10-CM

## 2016-05-11 NOTE — MAU Note (Signed)
Contractions every 5-7 min apart, since 10 pm.  Having pressure comes and goes with contractions. No leaking. No bleeding. 1.5 cm at last visit. Baby moving well.

## 2016-05-11 NOTE — MAU Note (Signed)
Due to computer system being down, unable to obtain electronic signature, copy of signature page  sent to medical records.

## 2016-05-12 ENCOUNTER — Encounter: Payer: Self-pay | Admitting: *Deleted

## 2016-05-12 ENCOUNTER — Ambulatory Visit (INDEPENDENT_AMBULATORY_CARE_PROVIDER_SITE_OTHER): Payer: Medicaid Other | Admitting: Obstetrics and Gynecology

## 2016-05-12 VITALS — BP 115/72 | HR 71 | Wt 172.0 lb

## 2016-05-12 DIAGNOSIS — O0993 Supervision of high risk pregnancy, unspecified, third trimester: Secondary | ICD-10-CM

## 2016-05-12 DIAGNOSIS — O0990 Supervision of high risk pregnancy, unspecified, unspecified trimester: Secondary | ICD-10-CM

## 2016-05-12 NOTE — Progress Notes (Signed)
Coughing up yellow sputum.

## 2016-05-12 NOTE — Patient Instructions (Signed)
Labor Induction Labor induction is when steps are taken to cause a pregnant woman to begin the labor process. Most women go into labor on their own between 37 weeks and 42 weeks of the pregnancy. When this does not happen or when there is a medical need, methods may be used to induce labor. Labor induction causes a pregnant woman's uterus to contract. It also causes the cervix to soften (ripen), open (dilate), and thin out (efface). Usually, labor is not induced before 39 weeks of the pregnancy unless there is a problem with the baby or mother. Before inducing labor, your health care provider will consider a number of factors, including the following:  The medical condition of you and the baby.  How many weeks along you are.  The status of the baby's lung maturity.  The condition of the cervix.  The position of the baby. What are the reasons for labor induction? Labor may be induced for the following reasons:  The health of the baby or mother is at risk.  The pregnancy is overdue by 1 week or more.  The water breaks but labor does not start on its own.  The mother has a health condition or serious illness, such as high blood pressure, infection, placental abruption, or diabetes.  The amniotic fluid amounts are low around the baby.  The baby is distressed. Convenience or wanting the baby to be born on a certain date is not a reason for inducing labor. What methods are used for labor induction? Several methods of labor induction may be used, such as:  Prostaglandin medicine. This medicine causes the cervix to dilate and ripen. The medicine will also start contractions. It can be taken by mouth or by inserting a suppository into the vagina.  Inserting a thin tube (catheter) with a balloon on the end into the vagina to dilate the cervix. Once inserted, the balloon is expanded with water, which causes the cervix to open.  Stripping the membranes. Your health care provider separates  amniotic sac tissue from the cervix, causing the cervix to be stretched and causing the release of a hormone called progesterone. This may cause the uterus to contract. It is often done during an office visit. You will be sent home to wait for the contractions to begin. You will then come in for an induction.  Breaking the water. Your health care provider makes a hole in the amniotic sac using a small instrument. Once the amniotic sac breaks, contractions should begin. This may still take hours to see an effect.  Medicine to trigger or strengthen contractions. This medicine is given through an IV access tube inserted into a vein in your arm. All of the methods of induction, besides stripping the membranes, will be done in the hospital. Induction is done in the hospital so that you and the baby can be carefully monitored. How long does it take for labor to be induced? Some inductions can take up to 2-3 days. Depending on the cervix, it usually takes less time. It takes longer when you are induced early in the pregnancy or if this is your first pregnancy. If a mother is still pregnant and the induction has been going on for 2-3 days, either the mother will be sent home or a cesarean delivery will be needed. What are the risks associated with labor induction? Some of the risks of induction include:  Changes in fetal heart rate, such as too high, too low, or erratic.  Fetal distress.    Chance of infection for the mother and baby.  Increased chance of having a cesarean delivery.  Breaking off (abruption) of the placenta from the uterus (rare).  Uterine rupture (very rare). When induction is needed for medical reasons, the benefits of induction may outweigh the risks. What are some reasons for not inducing labor? Labor induction should not be done if:  It is shown that your baby does not tolerate labor.  You have had previous surgeries on your uterus, such as a myomectomy or the removal of  fibroids.  Your placenta lies very low in the uterus and blocks the opening of the cervix (placenta previa).  Your baby is not in a head-down position.  The umbilical cord drops down into the birth canal in front of the baby. This could cut off the baby's blood and oxygen supply.  You have had a previous cesarean delivery.  There are unusual circumstances, such as the baby being extremely premature. This information is not intended to replace advice given to you by your health care provider. Make sure you discuss any questions you have with your health care provider. Document Released: 05/10/2006 Document Revised: 05/27/2015 Document Reviewed: 07/18/2012 Elsevier Interactive Patient Education  2017 [employer]  

## 2016-05-12 NOTE — Progress Notes (Signed)
   PRENATAL VISIT NOTE  Subjective:  Teresa Blanchard is a 26 y.o. G3P1011 at 5350w6d being seen today for ongoing prenatal care.  She is currently monitored for the following issues for this high-risk pregnancy and has History of 1 spontaneous abortion; Supervision of high risk pregnancy, antepartum; Prior perinatal loss in second trimester, antepartum; and Gastroesophageal reflux disease on her problem list.  Patient reports backache and contractions since yesterday. She was seen at the hospital for a labor evaluation Wednesday night/early Thursday morning.  Contractions: Irritability. Vag. Bleeding: None.  Movement: Present. Denies leaking of fluid.   The following portions of the patient's history were reviewed and updated as appropriate: allergies, current medications, past family history, past medical history, past social history, past surgical history and problem list. Problem list updated.  Objective:   Vitals:   05/12/16 0921  BP: 115/72  Pulse: 71  Weight: 172 lb (78 kg)    Fetal Status: Fetal Heart Rate (bpm): 121 Fundal Height: 40 cm Movement: Present  Presentation: Vertex  General:  Alert, oriented and cooperative. Patient is in no acute distress.  Skin: Skin is warm and dry. No rash noted.   Cardiovascular: Normal heart rate noted  Respiratory: Normal respiratory effort, no problems with respiration noted  Abdomen: Soft, gravid, appropriate for gestational age. Pain/Pressure: Present     Pelvic:  membranes stripped - small amount of blood on exam glove Dilation: 2.5 Effacement (%): 70 Station: -2  Extremities: Normal range of motion.  Edema: Trace  Mental Status: Normal mood and affect. Normal behavior. Normal judgment and thought content.   Assessment and Plan:  Pregnancy: G3P1011 at 8550w6d  1. Supervision of high risk pregnancy, antepartum - Membranes stripped - educated that not guaranteed to induce labor, expect bleeding and increased cramping and contractions,  encouraged to go for walk this afternoon to possibily get contractions coming in more regular frequency and increased intensity - Discussed IOL for next week - scheduled for Saturday 05/20/2016 - Start AT testing next week prior to IOL  Term labor symptoms and general obstetric precautions including but not limited to vaginal bleeding, contractions, leaking of fluid and fetal movement were reviewed in detail with the patient. Please refer to After Visit Summary for other counseling recommendations.  Return in about 1 week (around 05/19/2016) for Return OB - KV, NST (for postdates).   Teresa Blanchard, Teresa Blanchard, CNM

## 2016-05-13 ENCOUNTER — Inpatient Hospital Stay (HOSPITAL_COMMUNITY)
Admission: AD | Admit: 2016-05-13 | Discharge: 2016-05-15 | DRG: 775 | Disposition: A | Discharge status: Home / Self Care | Payer: Medicaid Other | Source: Ambulatory Visit | Attending: Obstetrics & Gynecology | Admitting: Obstetrics & Gynecology

## 2016-05-13 ENCOUNTER — Encounter (HOSPITAL_COMMUNITY): Payer: Self-pay | Admitting: *Deleted

## 2016-05-13 DIAGNOSIS — Z3A40 40 weeks gestation of pregnancy: Secondary | ICD-10-CM

## 2016-05-13 DIAGNOSIS — O09292 Supervision of pregnancy with other poor reproductive or obstetric history, second trimester: Secondary | ICD-10-CM

## 2016-05-13 DIAGNOSIS — O99334 Smoking (tobacco) complicating childbirth: Principal | ICD-10-CM | POA: Diagnosis present

## 2016-05-13 DIAGNOSIS — O479 False labor, unspecified: Secondary | ICD-10-CM | POA: Diagnosis present

## 2016-05-13 DIAGNOSIS — O0990 Supervision of high risk pregnancy, unspecified, unspecified trimester: Secondary | ICD-10-CM

## 2016-05-13 DIAGNOSIS — F17210 Nicotine dependence, cigarettes, uncomplicated: Secondary | ICD-10-CM | POA: Diagnosis present

## 2016-05-13 NOTE — MAU Note (Signed)
Pt reports contractions that started at 7pm and are every 7 mins. Pt denies LOF. Has some brown discharge. States membranes stripped yesterday and cervix was 2.5 cm. Reports good fetal movement.

## 2016-05-14 ENCOUNTER — Encounter (HOSPITAL_COMMUNITY): Payer: Self-pay | Admitting: Anesthesiology

## 2016-05-14 ENCOUNTER — Encounter (HOSPITAL_COMMUNITY): Payer: Self-pay

## 2016-05-14 ENCOUNTER — Inpatient Hospital Stay (HOSPITAL_COMMUNITY): Payer: Medicaid Other | Admitting: Anesthesiology

## 2016-05-14 DIAGNOSIS — O99334 Smoking (tobacco) complicating childbirth: Secondary | ICD-10-CM | POA: Diagnosis present

## 2016-05-14 DIAGNOSIS — Z3A40 40 weeks gestation of pregnancy: Secondary | ICD-10-CM

## 2016-05-14 DIAGNOSIS — F17210 Nicotine dependence, cigarettes, uncomplicated: Secondary | ICD-10-CM | POA: Diagnosis present

## 2016-05-14 DIAGNOSIS — Z3493 Encounter for supervision of normal pregnancy, unspecified, third trimester: Secondary | ICD-10-CM | POA: Diagnosis present

## 2016-05-14 DIAGNOSIS — O479 False labor, unspecified: Secondary | ICD-10-CM | POA: Diagnosis present

## 2016-05-14 LAB — TYPE AND SCREEN
ABO/RH(D): O POS
ANTIBODY SCREEN: NEGATIVE

## 2016-05-14 LAB — CBC
HEMATOCRIT: 32.5 % — AB (ref 36.0–46.0)
HEMOGLOBIN: 10.8 g/dL — AB (ref 12.0–15.0)
MCH: 26.5 pg (ref 26.0–34.0)
MCHC: 33.2 g/dL (ref 30.0–36.0)
MCV: 79.9 fL (ref 78.0–100.0)
Platelets: 171 10*3/uL (ref 150–400)
RBC: 4.07 MIL/uL (ref 3.87–5.11)
RDW: 14.5 % (ref 11.5–15.5)
WBC: 12.7 10*3/uL — AB (ref 4.0–10.5)

## 2016-05-14 LAB — ABO/RH: ABO/RH(D): O POS

## 2016-05-14 MED ORDER — LIDOCAINE-EPINEPHRINE 2 %-1:100000 IJ SOLN
INTRAMUSCULAR | Status: DC | PRN
  Administered 2016-05-14: 3 mL via INTRADERMAL

## 2016-05-14 MED ORDER — DIPHENHYDRAMINE HCL 50 MG/ML IJ SOLN: 12.5000 mg | INTRAMUSCULAR | Status: DC | PRN

## 2016-05-14 MED ORDER — OXYTOCIN 40 UNITS IN LACTATED RINGERS INFUSION - SIMPLE MED
2.5000 [IU]/h | INTRAVENOUS | Status: DC
  Administered 2016-05-14: 2.5 [IU]/h via INTRAVENOUS
  Filled 2016-05-14: qty 1000

## 2016-05-14 MED ORDER — COCONUT OIL OIL: 1.0000 "application " | TOPICAL_OIL | Status: DC | PRN

## 2016-05-14 MED ORDER — SODIUM BICARBONATE 8.4 % IV SOLN
INTRAVENOUS | Status: DC | PRN
  Administered 2016-05-14: 5 mL via EPIDURAL

## 2016-05-14 MED ORDER — SENNOSIDES-DOCUSATE SODIUM 8.6-50 MG PO TABS
2.0000 | ORAL_TABLET | ORAL | Status: DC
  Administered 2016-05-14: 2 via ORAL
  Filled 2016-05-14: qty 2

## 2016-05-14 MED ORDER — PHENYLEPHRINE 40 MCG/ML (10ML) SYRINGE FOR IV PUSH (FOR BLOOD PRESSURE SUPPORT)
80.0000 ug | PREFILLED_SYRINGE | INTRAVENOUS | Status: DC | PRN
  Filled 2016-05-14: qty 5

## 2016-05-14 MED ORDER — PRENATAL MULTIVITAMIN CH
1.0000 | ORAL_TABLET | Freq: Every day | ORAL | Status: DC
  Administered 2016-05-14 – 2016-05-15 (×2): 1 via ORAL
  Filled 2016-05-14 (×2): qty 1

## 2016-05-14 MED ORDER — LACTATED RINGERS IV SOLN
INTRAVENOUS | Status: DC
  Administered 2016-05-14 (×2): via INTRAVENOUS

## 2016-05-14 MED ORDER — TERBUTALINE SULFATE 1 MG/ML IJ SOLN
0.2500 mg | Freq: Once | INTRAMUSCULAR | Status: DC | PRN
  Filled 2016-05-14: qty 1

## 2016-05-14 MED ORDER — MEASLES, MUMPS & RUBELLA VAC ~~LOC~~ INJ
0.5000 mL | INJECTION | Freq: Once | SUBCUTANEOUS | Status: DC
  Filled 2016-05-14: qty 0.5

## 2016-05-14 MED ORDER — METHYLERGONOVINE MALEATE 0.2 MG PO TABS: 0.2000 mg | ORAL_TABLET | ORAL | Status: DC | PRN

## 2016-05-14 MED ORDER — LIDOCAINE HCL (PF) 1 % IJ SOLN
30.0000 mL | INTRAMUSCULAR | Status: DC | PRN
  Filled 2016-05-14: qty 30

## 2016-05-14 MED ORDER — SIMETHICONE 80 MG PO CHEW: 80.0000 mg | CHEWABLE_TABLET | ORAL | Status: DC | PRN

## 2016-05-14 MED ORDER — OXYCODONE-ACETAMINOPHEN 5-325 MG PO TABS: 1.0000 | ORAL_TABLET | ORAL | Status: DC | PRN

## 2016-05-14 MED ORDER — OXYTOCIN 40 UNITS IN LACTATED RINGERS INFUSION - SIMPLE MED
1.0000 m[IU]/min | INTRAVENOUS | Status: DC
  Administered 2016-05-14: 2 m[IU]/min via INTRAVENOUS

## 2016-05-14 MED ORDER — OXYCODONE HCL 5 MG PO TABS: 5.0000 mg | ORAL_TABLET | ORAL | Status: DC | PRN

## 2016-05-14 MED ORDER — IBUPROFEN 600 MG PO TABS
600.0000 mg | ORAL_TABLET | Freq: Four times a day (QID) | ORAL | Status: DC
  Administered 2016-05-14 – 2016-05-15 (×5): 600 mg via ORAL
  Filled 2016-05-14 (×5): qty 1

## 2016-05-14 MED ORDER — ONDANSETRON HCL 4 MG PO TABS: 4.0000 mg | ORAL_TABLET | ORAL | Status: DC | PRN

## 2016-05-14 MED ORDER — ACETAMINOPHEN 325 MG PO TABS: 650.0000 mg | ORAL_TABLET | ORAL | Status: DC | PRN

## 2016-05-14 MED ORDER — FENTANYL CITRATE (PF) 100 MCG/2ML IJ SOLN
INTRAMUSCULAR | Status: AC
  Filled 2016-05-14: qty 2

## 2016-05-14 MED ORDER — EPHEDRINE 5 MG/ML INJ
10.0000 mg | INTRAVENOUS | Status: DC | PRN
  Filled 2016-05-14: qty 2

## 2016-05-14 MED ORDER — ZOLPIDEM TARTRATE 5 MG PO TABS: 5.0000 mg | ORAL_TABLET | Freq: Every evening | ORAL | Status: DC | PRN

## 2016-05-14 MED ORDER — OXYCODONE-ACETAMINOPHEN 5-325 MG PO TABS: 2.0000 | ORAL_TABLET | ORAL | Status: DC | PRN

## 2016-05-14 MED ORDER — ONDANSETRON HCL 4 MG/2ML IJ SOLN: 4.0000 mg | Freq: Four times a day (QID) | INTRAMUSCULAR | Status: DC | PRN

## 2016-05-14 MED ORDER — ONDANSETRON HCL 4 MG/2ML IJ SOLN: 4.0000 mg | INTRAMUSCULAR | Status: DC | PRN

## 2016-05-14 MED ORDER — BENZOCAINE-MENTHOL 20-0.5 % EX AERO: 1.0000 "application " | INHALATION_SPRAY | CUTANEOUS | Status: DC | PRN

## 2016-05-14 MED ORDER — TETANUS-DIPHTH-ACELL PERTUSSIS 5-2.5-18.5 LF-MCG/0.5 IM SUSP: 0.5000 mL | Freq: Once | INTRAMUSCULAR | Status: DC

## 2016-05-14 MED ORDER — PHENYLEPHRINE 40 MCG/ML (10ML) SYRINGE FOR IV PUSH (FOR BLOOD PRESSURE SUPPORT)
80.0000 ug | PREFILLED_SYRINGE | INTRAVENOUS | Status: DC | PRN
  Filled 2016-05-14: qty 10
  Filled 2016-05-14: qty 5

## 2016-05-14 MED ORDER — LACTATED RINGERS IV SOLN
500.0000 mL | Freq: Once | INTRAVENOUS | Status: AC
  Administered 2016-05-14: 500 mL via INTRAVENOUS

## 2016-05-14 MED ORDER — DIBUCAINE 1 % RE OINT: 1.0000 "application " | TOPICAL_OINTMENT | RECTAL | Status: DC | PRN

## 2016-05-14 MED ORDER — FENTANYL 2.5 MCG/ML BUPIVACAINE 1/10 % EPIDURAL INFUSION (WH - ANES)
14.0000 mL/h | INTRAMUSCULAR | Status: DC | PRN
  Administered 2016-05-14: 14 mL/h via EPIDURAL
  Filled 2016-05-14: qty 100

## 2016-05-14 MED ORDER — SOD CITRATE-CITRIC ACID 500-334 MG/5ML PO SOLN
30.0000 mL | ORAL | Status: DC | PRN
Start: 2016-05-14 — End: 2016-05-14

## 2016-05-14 MED ORDER — OXYCODONE HCL 5 MG PO TABS: 10.0000 mg | ORAL_TABLET | ORAL | Status: DC | PRN

## 2016-05-14 MED ORDER — METHYLERGONOVINE MALEATE 0.2 MG/ML IJ SOLN: 0.2000 mg | INTRAMUSCULAR | Status: DC | PRN

## 2016-05-14 MED ORDER — WITCH HAZEL-GLYCERIN EX PADS: 1.0000 "application " | MEDICATED_PAD | CUTANEOUS | Status: DC | PRN

## 2016-05-14 MED ORDER — DIPHENHYDRAMINE HCL 25 MG PO CAPS: 25.0000 mg | ORAL_CAPSULE | Freq: Four times a day (QID) | ORAL | Status: DC | PRN

## 2016-05-14 MED ORDER — FENTANYL CITRATE (PF) 100 MCG/2ML IJ SOLN
100.0000 ug | INTRAMUSCULAR | Status: DC | PRN
  Administered 2016-05-14: 100 ug via INTRAVENOUS

## 2016-05-14 MED ORDER — LACTATED RINGERS IV SOLN: 500.0000 mL | INTRAVENOUS | Status: DC | PRN

## 2016-05-14 MED ORDER — OXYTOCIN BOLUS FROM INFUSION
500.0000 mL | Freq: Once | INTRAVENOUS | Status: AC
  Administered 2016-05-14: 500 mL via INTRAVENOUS

## 2016-05-14 NOTE — Anesthesia Preprocedure Evaluation (Signed)
Anesthesia Evaluation  Patient identified by MRN, date of birth, ID band Patient awake    History of Anesthesia Complications Negative for: history of anesthetic complications  Airway Mallampati: I  TM Distance: >3 FB     Dental  (+) Teeth Intact   Pulmonary neg pulmonary ROS, Current Smoker,    Pulmonary exam normal        Cardiovascular negative cardio ROS Normal cardiovascular exam     Neuro/Psych negative neurological ROS  negative psych ROS   GI/Hepatic Neg liver ROS, GERD  ,  Endo/Other  negative endocrine ROS  Renal/GU negative Renal ROS  negative genitourinary   Musculoskeletal   Abdominal   Peds negative pediatric ROS (+)  Hematology negative hematology ROS (+)   Anesthesia Other Findings   Reproductive/Obstetrics negative OB ROS (+) Pregnancy                             Anesthesia Physical Anesthesia Plan  ASA: II  Anesthesia Plan: Epidural   Post-op Pain Management:    Induction:   Airway Management Planned: Simple Face Mask  Additional Equipment:   Intra-op Plan:   Post-operative Plan:   Informed Consent: I have reviewed the patients History and Physical, chart, labs and discussed the procedure including the risks, benefits and alternatives for the proposed anesthesia with the patient or authorized representative who has indicated his/her understanding and acceptance.     Plan Discussed with:   Anesthesia Plan Comments:         Anesthesia Quick Evaluation

## 2016-05-14 NOTE — Anesthesia Procedure Notes (Signed)
Epidural Patient location during procedure: OB Start time: 05/14/2016 5:28 AM  Staffing Anesthesiologist: Odette FractionHARKINS, Takeshia Wenk Performed: anesthesiologist   Preanesthetic Checklist Completed: patient identified, site marked, surgical consent, pre-op evaluation, timeout performed, IV checked, risks and benefits discussed and monitors and equipment checked  Epidural Patient position: sitting Prep: DuraPrep Patient monitoring: heart rate, continuous pulse ox and blood pressure Approach: midline Location: L4-L5 Injection technique: LOR air and LOR saline  Needle:  Needle type: Tuohy  Needle gauge: 18 G Needle length: 9 cm Needle insertion depth: 9 cm Catheter type: closed end flexible Catheter size: 20 Guage Catheter at skin depth: 15 cm Test dose: 2% lidocaine with Epi 1:200 K and negative  Assessment Events: blood not aspirated, injection not painful, no injection resistance and negative IV test  Additional Notes Reason for block:procedure for pain

## 2016-05-14 NOTE — Lactation Note (Signed)
This note was copied from a baby's chart. Lactation Consultation Note  Patient Name: Teresa Blanchard WUJWJ'XToday's Date: 05/14/2016 Reason for consult: Initial assessment  Initial visit at 14 hours of age.  Mom reports a few good feedings and denies pain with latching.  Mom reports nursing older child for 4 months.   [facility] and discussed.  Encouraged to feed with early cues on demand.  Early newborn behavior discussed.  Hand expression reported by mom with colostrum visible, breast assessment deferred at this time.  Mom to call for assist as needed.     Maternal Data Has patient been taught Hand Expression?: Yes Does the patient have breastfeeding experience prior to this delivery?: Yes  Feeding Feeding Type: Breast Fed Length of feed: 15 min  LATCH Score/Interventions                Intervention(s): Breastfeeding basics reviewed     Lactation Tools Discussed/Used WIC Program: No   Consult Status Consult Status: Follow-up Date: 05/15/16 Follow-up type: In-patient    Jannifer RodneyShoptaw, Mariene Dickerman Lynn 05/14/2016, 10:43 PM

## 2016-05-14 NOTE — H&P (Signed)
Teresa Blanchard is a 26 y.o. female presenting for uterine contractions Denies leaking or bleeding and reports fetal movements  [place] Hx 16wk homedelivery with PPROM and hemorrhage in 2014 Cervix was 2.5cm 2 days ago . OB History    Gravida Para Term Preterm AB Living   3 1 1   1 1    SAB TAB Ectopic Multiple Live Births   1       1     Past Medical History:  Diagnosis Date  . Medical history non-contributory    Past Surgical History:  Procedure Laterality Date  . DILATION AND CURETTAGE, DIAGNOSTIC / THERAPEUTIC     Family History: family history includes Breast cancer in her other. Social History:  reports that she has been smoking Cigarettes.  She has been smoking about 0.25 packs per day. She has never used smokeless tobacco. She reports that she does not drink alcohol or use drugs.     Maternal Diabetes: No Genetic Screening: Normal Maternal Ultrasounds/Referrals: Normal Fetal Ultrasounds or other Referrals:  None Maternal Substance Abuse:  No Significant Maternal Medications:  None Significant Maternal Lab Results:  Lab values include: Group B Strep negative Other Comments:  None  Review of Systems  Constitutional: Negative for chills and fever.  Respiratory: Negative for shortness of breath.   Cardiovascular: Negative for leg swelling.  Gastrointestinal: Positive for abdominal pain. Negative for constipation, diarrhea, nausea and vomiting.  Genitourinary: Negative for dysuria.  Neurological: Negative for dizziness.   Maternal Medical History:  Reason for admission: Contractions.  Nausea.  Contractions: Onset was 3-5 hours ago.   Frequency: irregular.   Perceived severity is moderate.    Fetal activity: Perceived fetal activity is normal.   Last perceived fetal movement was within the past hour.    Prenatal complications: No bleeding, PIH, infection, placental abnormality, polyhydramnios, pre-eclampsia or preterm labor.    Prenatal Complications - Diabetes: none.    Dilation: 3.5 Effacement (%): 80 Station: -1 Exam by:: Rivaan Kendall CNM Blood pressure 123/76, pulse 87, temperature 97.7 F (36.5 C), temperature source Oral, resp. rate 16, last menstrual period 08/07/2015, SpO2 99 %. Maternal Exam:  Uterine Assessment: Contraction strength is moderate.  Contraction frequency is irregular.   Abdomen: Patient reports no abdominal tenderness. Fundal height is 39.   Estimated fetal weight is 7.   Fetal presentation: vertex  Introitus: Normal vulva. Normal vagina.  Vagina is negative for discharge.  Ferning test: not done.  Nitrazine test: not done. Amniotic fluid character: not assessed.  Pelvis: adequate for delivery.   Cervix: Cervix evaluated by digital exam.     Fetal Exam Fetal Monitor Review: Mode: ultrasound.   Baseline rate: 110.  Variability: moderate (6-25 bpm).   Pattern: accelerations present and no decelerations.    Fetal State Assessment: Category I - tracings are normal.     Physical Exam  Constitutional: She is oriented to person, place, and time. She appears well-developed and well-nourished.  HENT:  Head: Normocephalic.  Cardiovascular: Normal rate, regular rhythm and normal heart sounds.   Respiratory: Effort normal. No respiratory distress. She has no wheezes. She has no rales.  GI: Soft. She exhibits no distension. There is no tenderness. There is no rebound and no guarding.  Genitourinary: Vagina normal. No vaginal discharge found.  Genitourinary Comments: Dilation: 3.5 Effacement (%): 80 Cervical Position: Posterior Station: -1 Presentation: Vertex Exam by:: Jaleah Lefevre CNM   Musculoskeletal: Normal range of motion.  Neurological: She is alert and oriented to person,  place, and time.  Skin: Skin is warm and dry.  Psychiatric: She has a normal mood and affect.    Prenatal labs: ABO, Rh: O/POS/-- (10/27 1610) Antibody: NEG (10/27 9604) Rubella: 6.95 (10/27  0907) RPR: NON REAC (02/23 0843)  HBsAg: NEGATIVE (10/27 0907)  HIV: NONREACTIVE (02/23 0843)  GBS:     Assessment/Plan: Single IUP at [redacted]w[redacted]d Latent phase labor Advanced cervical dilation Low FHR baseline, but within normal limits  Discussed with Dr Macon Large Since patient is not in active labor, discussed option of discharge home. Patient is very nervous about this due to history of delivery at home with hemorrhage Will admit for augmentation of labor Will start with AROM and may need to add Pitocin later if necessary    Wynelle Bourgeois 05/14/2016, 1:26 AM

## 2016-05-15 LAB — RPR: RPR Ser Ql: NONREACTIVE

## 2016-05-15 MED ORDER — IBUPROFEN 600 MG PO TABS
600.0000 mg | ORAL_TABLET | Freq: Four times a day (QID) | ORAL | 0 refills | Status: DC
Start: 2016-05-15 — End: 2018-08-28

## 2016-05-15 MED ORDER — DOCUSATE SODIUM 100 MG PO CAPS: 100.0000 mg | ORAL_CAPSULE | Freq: Two times a day (BID) | ORAL | 0 refills | Status: DC

## 2016-05-15 NOTE — Lactation Note (Signed)
This note was copied from a baby's chart. Lactation Consultation Note  Patient Name: Teresa Blanchard ZOXWR'UToday's Date: 05/15/2016 Reason for consult: Follow-up assessment;Infant weight loss (4% WEIGHT LOSS )  Baby is 6027 hour old  LC was asked to see dyad by Pedis - Dr. Charlesetta GaribaldiHall  LC entered  the room and the baby was already latched with depth , multiple swallows noted,  And per mom comfortable, when the baby released nipple well rounded. Baby was switched to the 2nd breast ( Left ), cradle position, and mom was independent with latch and obtaining depth at the breast. Multiple swallows noted.  LC reviewed importance of watching for nutritive vs non - nutritive feeding feeding cues, depth at the breast, and support on her back so it doesn't become uncomfortable.  Mom denies experiencing engorgement with her 1st baby and any soreness presently.  Sore nipple  And engorgement prevention and tx reviewed.  LC instructed mom on the use of hand pump/ and cleaning.  Per mom not active with WIC. /  LC recommended since she has Medicaid to consider calling [facility] in her county to become  Active if she goes back to work for [employer] loan[employer].  Mother informed of post-discharge support and given phone number to the lactation department, including services for phone call assistance; out-patient appointments; and breastfeeding support group. List of other breastfeeding resources in the community given in the handout. Encouraged mother to call for problems or concerns related to breastfeeding.  Maternal Data    Feeding Feeding Type: Breast Fed Length of feed:  (multiple swallows )  LATCH Score/Interventions Latch: Grasps breast easily, tongue down, lips flanged, rhythmical sucking.  Audible Swallowing: Spontaneous and intermittent Intervention(s): Hand expression;Skin to skin;Alternate breast massage  Type of Nipple: Everted at rest and after stimulation  Comfort (Breast/Nipple): Soft / non-tender     Hold  (Positioning): No assistance needed to correctly position infant at breast. Intervention(s): Breastfeeding basics reviewed;Support Pillows;Position options;Skin to skin  LATCH Score: 10  Lactation Tools Discussed/Used Tools: Pump Breast pump type: Manual   Consult Status Consult Status: Complete Date: 05/15/16    Kathrin GreathouseMargaret Ann Morrison Mcbryar 05/15/2016, 11:35 AM

## 2016-05-15 NOTE — Discharge Instructions (Signed)
Postpartum Care After Vaginal Delivery °The period of time right after you deliver your newborn is called the postpartum period. °What kind of medical care will I receive? °· You may continue to receive fluids and medicines through an IV tube inserted into one of your veins. °· If an incision was made near your vagina (episiotomy) or if you had some vaginal tearing during delivery, cold compresses may be placed on your episiotomy or your tear. This helps to reduce pain and swelling. °· You may be given a squirt bottle to use when you go to the bathroom. You may use this until you are comfortable wiping as usual. To use the squirt bottle, follow these steps: °¨ Before you urinate, fill the squirt bottle with warm water. Do not use hot water. °¨ After you urinate, while you are sitting on the toilet, use the squirt bottle to rinse the area around your urethra and vaginal opening. This rinses away any urine and blood. °¨ You may do this instead of wiping. As you start healing, you may use the squirt bottle before wiping yourself. Make sure to wipe gently. °¨ Fill the squirt bottle with clean water every time you use the bathroom. °· You will be given sanitary pads to wear. °How can I expect to feel? °· You may not feel the need to urinate for several hours after delivery. °· You will have some soreness and pain in your abdomen and vagina. °· If you are breastfeeding, you may have uterine contractions every time you breastfeed for up to several weeks postpartum. Uterine contractions help your uterus return to its normal size. °· It is normal to have vaginal bleeding (lochia) after delivery. The amount and appearance of lochia is often similar to a menstrual period in the first week after delivery. It will gradually decrease over the next few weeks to a dry, yellow-brown discharge. For most women, lochia stops completely by 6-8 weeks after delivery. Vaginal bleeding can vary from woman to woman. °· Within the first few  days after delivery, you may have breast engorgement. This is when your breasts feel heavy, full, and uncomfortable. Your breasts may also throb and feel hard, tightly stretched, warm, and tender. After this occurs, you may have milk leaking from your breasts. Your health care provider can help you relieve discomfort due to breast engorgement. Breast engorgement should go away within a few days. °· You may feel more sad or worried than normal due to hormonal changes after delivery. These feelings should not last more than a few days. If these feelings do not go away after several days, speak with your health care provider. °How should I care for myself? °· Tell your health care provider if you have pain or discomfort. °· Drink enough water to keep your urine clear or pale yellow. °· Wash your hands thoroughly with soap and water for at least 20 seconds after changing your sanitary pads, after using the toilet, and before holding or feeding your baby. °· If you are not breastfeeding, avoid touching your breasts a lot. Doing this can make your breasts produce more milk. °· If you become weak or lightheaded, or you feel like you might faint, ask for help before: °¨ Getting out of bed. °¨ Showering. °· Change your sanitary pads frequently. Watch for any changes in your flow, such as a sudden increase in volume, a change in color, the passing of large blood clots. If you pass a blood clot from your vagina, save it   to show to your health care provider. Do not flush blood clots down the toilet without having your health care provider look at them.  Make sure that all your vaccinations are up to date. This can help protect you and your baby from getting certain diseases. You may need to have immunizations done before you leave the hospital.  If desired, talk with your health care provider about methods of family planning or birth control (contraception). How can I start bonding with my baby? Spending as much time as  possible with your baby is very important. During this time, you and your baby can get to know each other and develop a bond. Having your baby stay with you in your room (rooming in) can give you time to get to know your baby. Rooming in can also help you become comfortable caring for your baby. Breastfeeding can also help you bond with your baby. How can I plan for returning home with my baby?  Make sure that you have a car seat installed in your vehicle.  Your car seat should be checked by a certified car seat installer to make sure that it is installed safely.  Make sure that your baby fits into the car seat safely.  Ask your health care provider any questions you have about caring for yourself or your baby. Make sure that you are able to contact your health care provider with any questions after leaving the hospital. This information is not intended to replace advice given to you by your health care provider. Make sure you discuss any questions you have with your health care provider. Document Released: 10/16/2006 Document Revised: 05/24/2015 Document Reviewed: 11/23/2014 Elsevier Interactive Patient Education  2017 [employer].  Contraception Choices Contraception (birth control) is the use of any methods or devices to prevent pregnancy. Below are some methods to help avoid pregnancy. Hormonal methods  Contraceptive implant. This is a thin, plastic tube containing progesterone hormone. It does not contain estrogen hormone. Your health care provider inserts the tube in the inner part of the upper arm. The tube can remain in place for up to 3 years. After 3 years, the implant must be removed. The implant prevents the ovaries from releasing an egg (ovulation), thickens the cervical mucus to prevent sperm from entering the uterus, and thins the lining of the inside of the uterus.  Progesterone-only injections. These injections are given every 3 months by your health care provider to prevent  pregnancy. This synthetic progesterone hormone stops the ovaries from releasing eggs. It also thickens cervical mucus and changes the uterine lining. This makes it harder for sperm to survive in the uterus.  Birth control pills. These pills contain estrogen and progesterone hormone. They work by preventing the ovaries from releasing eggs (ovulation). They also cause the cervical mucus to thicken, preventing the sperm from entering the uterus. Birth control pills are prescribed by a health care provider.Birth control pills can also be used to treat heavy periods.  Minipill. This type of birth control pill contains only the progesterone hormone. They are taken every day of each month and must be prescribed by your health care provider.  Birth control patch. The patch contains hormones similar to those in birth control pills. It must be changed once a week and is prescribed by a health care provider.  Vaginal ring. The ring contains hormones similar to those in birth control pills. It is left in the vagina for 3 weeks, removed for 1 week, and then  a new one is put back in place. The patient must be comfortable inserting and removing the ring from the vagina.A health care provider's prescription is necessary.  Emergency contraception. Emergency contraceptives prevent pregnancy after unprotected sexual intercourse. This pill can be taken right after sex or up to 5 days after unprotected sex. It is most effective the sooner you take the pills after having sexual intercourse. Most emergency contraceptive pills are available without a prescription. Check with your pharmacist. Do not use emergency contraception as your only form of birth control. Barrier methods  Female condom. This is a thin sheath (latex or rubber) that is worn over the penis during sexual intercourse. It can be used with spermicide to increase effectiveness.  Female condom. This is a soft, loose-fitting sheath that is put into the vagina  before sexual intercourse.  Diaphragm. This is a soft, latex, dome-shaped barrier that must be fitted by a health care provider. It is inserted into the vagina, along with a spermicidal jelly. It is inserted before intercourse. The diaphragm should be left in the vagina for 6 to 8 hours after intercourse.  Cervical cap. This is a round, soft, latex or plastic cup that fits over the cervix and must be fitted by a health care provider. The cap can be left in place for up to 48 hours after intercourse.  Sponge. This is a soft, circular piece of polyurethane foam. The sponge has spermicide in it. It is inserted into the vagina after wetting it and before sexual intercourse.  Spermicides. These are chemicals that kill or block sperm from entering the cervix and uterus. They come in the form of creams, jellies, suppositories, foam, or tablets. They do not require a prescription. They are inserted into the vagina with an applicator before having sexual intercourse. The process must be repeated every time you have sexual intercourse. Intrauterine contraception  Intrauterine device (IUD). This is a T-shaped device that is put in a woman's uterus during a menstrual period to prevent pregnancy. There are 2 types:  Copper IUD. This type of IUD is wrapped in copper wire and is placed inside the uterus. Copper makes the uterus and fallopian tubes produce a fluid that kills sperm. It can stay in place for 10 years.  Hormone IUD. This type of IUD contains the hormone progestin (synthetic progesterone). The hormone thickens the cervical mucus and prevents sperm from entering the uterus, and it also thins the uterine lining to prevent implantation of a fertilized egg. The hormone can weaken or kill the sperm that get into the uterus. It can stay in place for 3-5 years, depending on which type of IUD is used. Permanent methods of contraception  Female tubal ligation. This is when the woman's fallopian tubes are  surgically sealed, tied, or blocked to prevent the egg from traveling to the uterus.  Hysteroscopic sterilization. This involves placing a small coil or insert into each fallopian tube. Your doctor uses a technique called hysteroscopy to do the procedure. The device causes scar tissue to form. This results in permanent blockage of the fallopian tubes, so the sperm cannot fertilize the egg. It takes about 3 months after the procedure for the tubes to become blocked. You must use another form of birth control for these 3 months.  Female sterilization. This is when the female has the tubes that carry sperm tied off (vasectomy).This blocks sperm from entering the vagina during sexual intercourse. After the procedure, the man can still ejaculate fluid (semen). Natural  planning methods  Natural family planning. This is not having sexual intercourse or using a barrier method (condom, diaphragm, cervical cap) on days the woman could become pregnant.  Calendar method. This is keeping track of the length of each menstrual cycle and identifying when you are fertile.  Ovulation method. This is avoiding sexual intercourse during ovulation.  Symptothermal method. This is avoiding sexual intercourse during ovulation, using a thermometer and ovulation symptoms.  Post-ovulation method. This is timing sexual intercourse after you have ovulated. Regardless of which type or method of contraception you choose, it is important that you use condoms to protect against the transmission of sexually transmitted infections (STIs). Talk with your health care provider about which form of contraception is most appropriate for you. This information is not intended to replace advice given to you by your health care provider. Make sure you discuss any questions you have with your health care provider. Document Released: 12/19/2004 Document Revised: 05/27/2015 Document Reviewed: 06/13/2012 Elsevier Interactive Patient Education  2017  [employer]  Places to have your son circumcised:    [facility] [phone] $480 by 4 wks  [facility] [phone] $244 by 4 wks  [facility] [phone] $175 by 2 wks  [facility] [phone] $250 by 7 days [facility] [phone] $150 by 4 wks  These prices sometimes change but are roughly what you can expect to pay. Please call and confirm pricing.   Circumcision is considered an elective/non-medically necessary procedure. There are many reasons parents decide to have their sons circumsized. During the first year of life circumcised males have a reduced risk of urinary tract infections but after this year the rates between circumcised males and uncircumcised males are the same.  It is safe to have your son circumcised outside of the hospital and the places above perform them regularly.

## 2016-05-15 NOTE — Anesthesia Postprocedure Evaluation (Signed)
Anesthesia Post Note  Patient: Teresa Blanchard  Procedure(s) Performed: * No procedures listed *  Patient location during evaluation: Mother Baby Anesthesia Type: Epidural Level of consciousness: awake Pain management: satisfactory to patient Vital Signs Assessment: post-procedure vital signs reviewed and stable Respiratory status: spontaneous breathing Cardiovascular status: stable Anesthetic complications: no        Last Vitals:  Vitals:   05/14/16 2330 05/15/16 0552  BP: 125/73 109/73  Pulse: 75 (!) 56  Resp: 18 16  Temp: 36.8 C 36.8 C    Last Pain:  Vitals:   05/15/16 0749  TempSrc:   PainSc: 0-No pain   Pain Goal: Patients Stated Pain Goal: 3 (05/14/16 1015)               Cephus ShellingBURGER,Antionette Luster

## 2016-05-15 NOTE — Discharge Summary (Signed)
OB Discharge Summary     Patient Name: Teresa Blanchard DOB: 12/26/1990 MRN: [id number]  Date of admission: 05/13/2016 Delivering MD: Wende Mott   Date of discharge: 05/15/2016  Admitting diagnosis: 40wks contractions 54mns  Intrauterine pregnancy: 467w1d   Secondary diagnosis:  Principal Problem:   NSVD (normal spontaneous vaginal delivery) Active Problems:   Uterine contractions during pregnancy  Additional problems: None     Discharge diagnosis: Term Pregnancy Delivered                                                                                                Post partum procedures:None  Augmentation: AROM and Pitocin  Complications: None  Hospital course:  Onset of Labor With Vaginal Delivery     [age].o. yo [id number] 4058w1ds admitted in Active Labor on 05/13/2016. Patient had an uncomplicated labor course as follows:  Membrane Rupture Time/Date: 2:42 AM ,05/14/2016   Intrapartum Procedures: Episiotomy: None [1]                                         Lacerations:  None [1]  Patient had a delivery of a Viable infant. 05/14/2016  Information for the patient's newborn:  Teresa Blanchard, Olazabal3[[id number]]elivery Method: Vaginal, Spontaneous Delivery (Filed from Delivery Summary)    Pateint had an uncomplicated postpartum course.  She is ambulating, tolerating a regular diet, passing flatus, and urinating well. Patient is discharged home in stable condition on 05/15/16.   Physical exam  Vitals:   05/14/16 1130 05/14/16 1455 05/14/16 2330 05/15/16 0552  BP: 117/63 108/61 125/73 109/73  Pulse: 67 (!) 57 75 (!) 56  Resp: '20 18 18 16  ' Temp: 98 F (36.7 C) 98.6 F (37 C) 98.3 F (36.8 C) 98.2 F (36.8 C)  TempSrc:   Oral Oral  SpO2:   98% 99%  Weight:      Height:       General: alert, cooperative and no distress Lochia: appropriate Uterine Fundus: firm Incision: N/A DVT Evaluation: No evidence of DVT seen on physical exam. Negative Homan's  sign. No cords or calf tenderness. Labs: Lab Results  Component Value Date   WBC 12.7 (H) 05/14/2016   HGB 10.8 (L) 05/14/2016   HCT 32.5 (L) 05/14/2016   MCV 79.9 05/14/2016   PLT 171 05/14/2016   CMP Latest Ref Rng & Units 10/29/2015  Glucose 65 - 99 mg/dL 70    Discharge instruction: per After Visit Summary and "Baby and Me Booklet".  After visit meds:  Allergies as of 05/15/2016   No Known Allergies     Medication List    STOP taking these medications   SINUS RINSE BOTTLE KIT Pack     TAKE these medications   docusate sodium 100 MG capsule Commonly known as:  COLACE Take 1 capsule (100 mg total) by mouth 2 (two) times daily.   ibuprofen 600 MG tablet Commonly known as:  ADVIL,MOTRIN Take 1 tablet (600 mg total)  by mouth every 6 (six) hours.   pantoprazole 20 MG tablet Commonly known as:  PROTONIX Take 1 tablet (20 mg total) by mouth 2 (two) times daily.   prenatal multivitamin Tabs tablet Take 1 tablet by mouth daily at 12 noon.       Diet: routine diet  Activity: Advance as tolerated. Pelvic rest for 6 weeks.   Outpatient follow up:6 weeks Follow up Appt:No future appointments. Follow up Visit:No Follow-up on file.  Postpartum contraception: Condoms, unsure  Newborn Data: Live born female  Birth Weight: 6 lb 11.8 oz (3055 g) APGAR: 8, 9  Baby Feeding: Breast Disposition:home with mother   05/15/2016 Teresa Basset, DO OB Fellow

## 2016-05-20 ENCOUNTER — Inpatient Hospital Stay (HOSPITAL_COMMUNITY): Payer: Medicaid Other

## 2017-10-30 IMAGING — US US MFM FETAL NUCHAL TRANSLUCENCY
1 series · 15 of 21 positions shown · non-contrast
Comparison: none

[Series 1: us mfm fetal nuchal translucency · 15 of 21 slices shown]
[im 1/21]
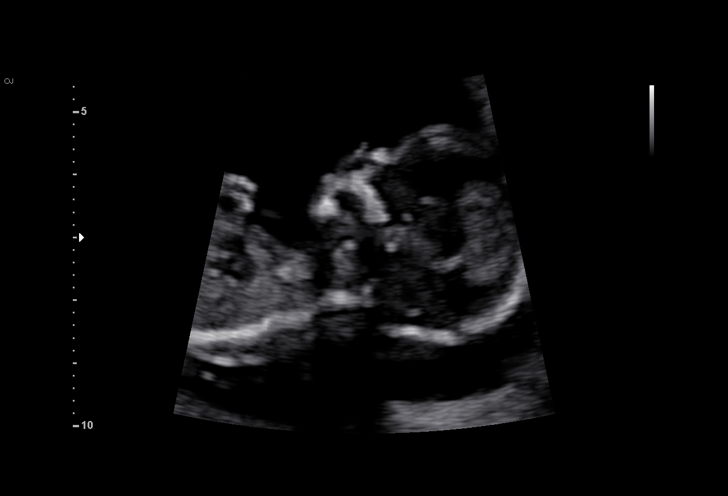
[im 3/21]
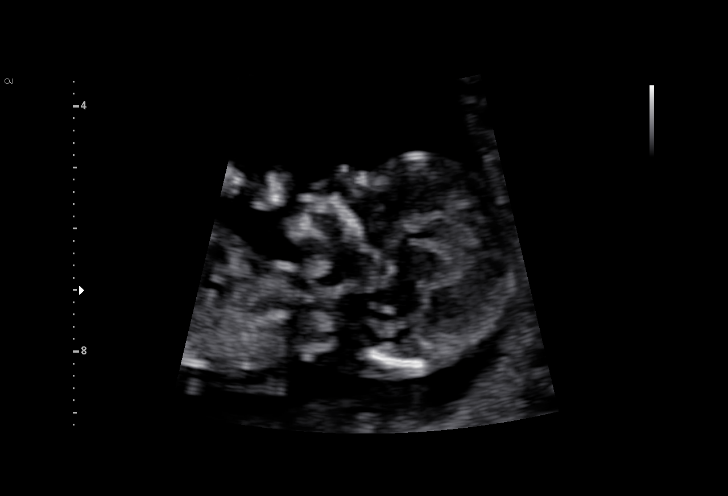
[im 4/21]
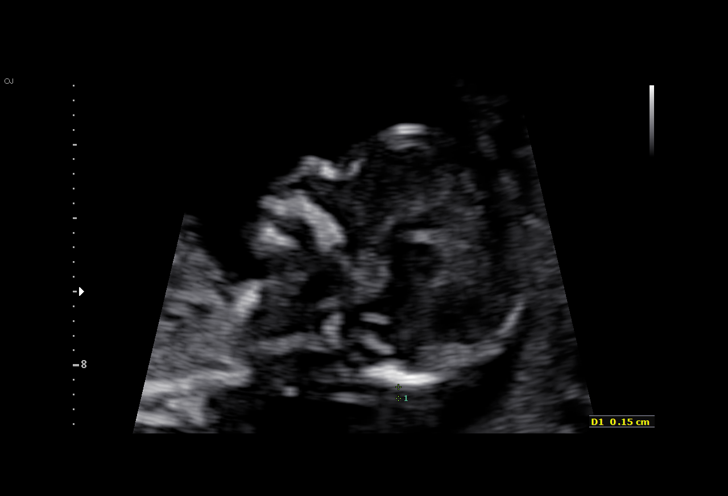
[im 5/21]
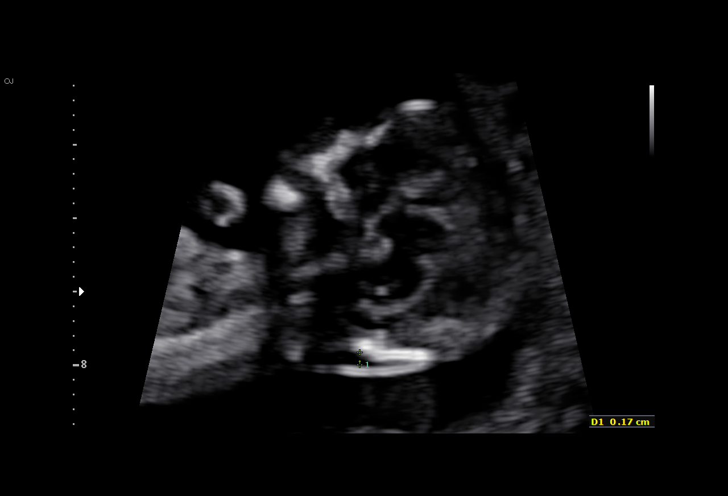
[im 7/21]
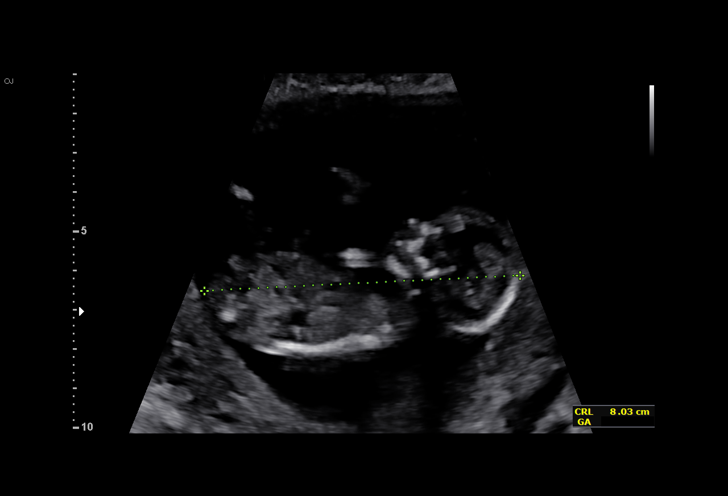
[im 8/21]
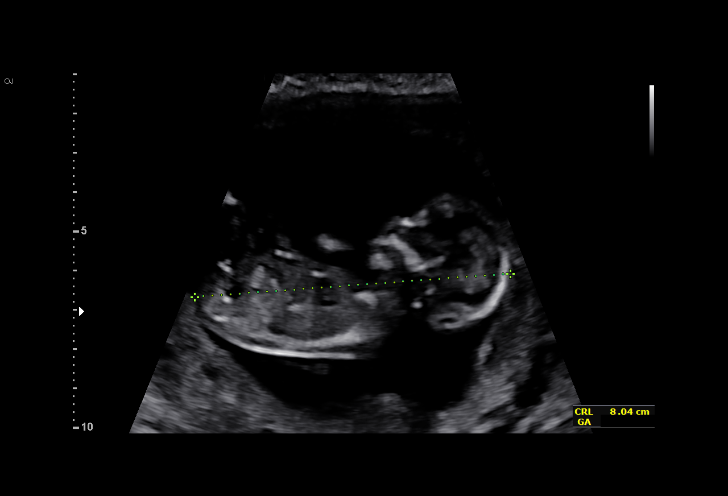
[im 10/21]
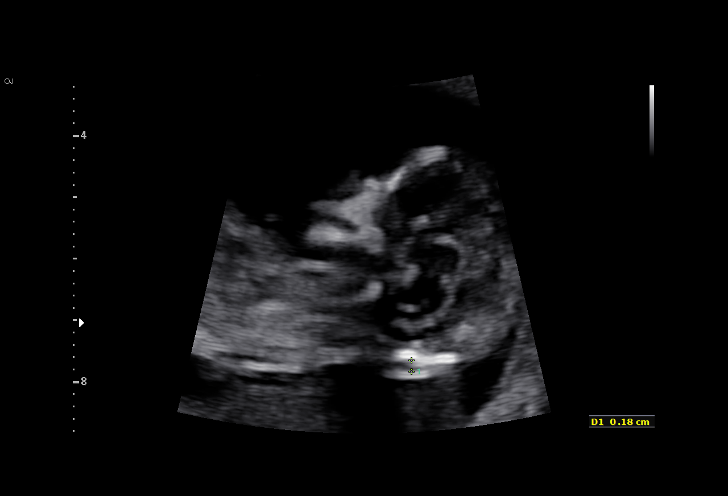
[im 11/21]
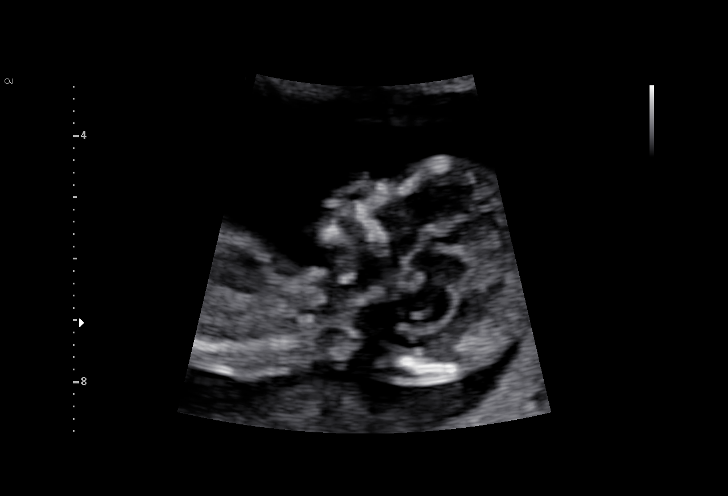
[im 12/21]
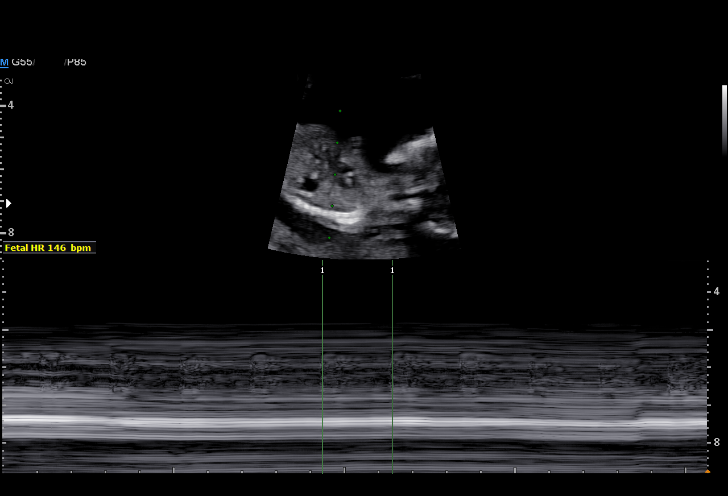
[im 14/21]
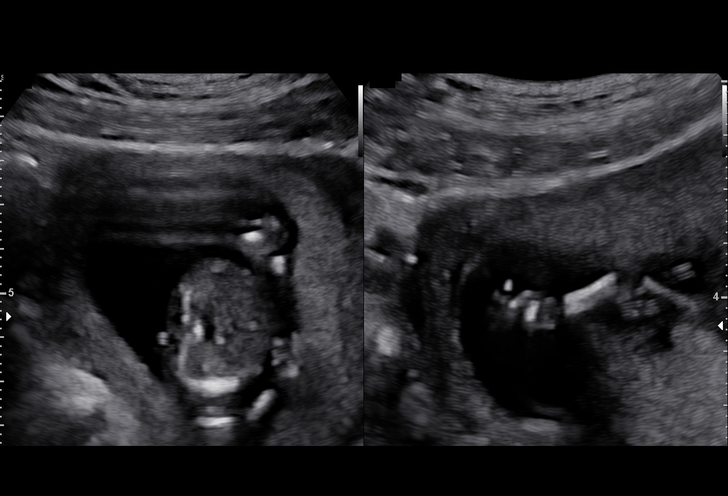
[im 15/21]
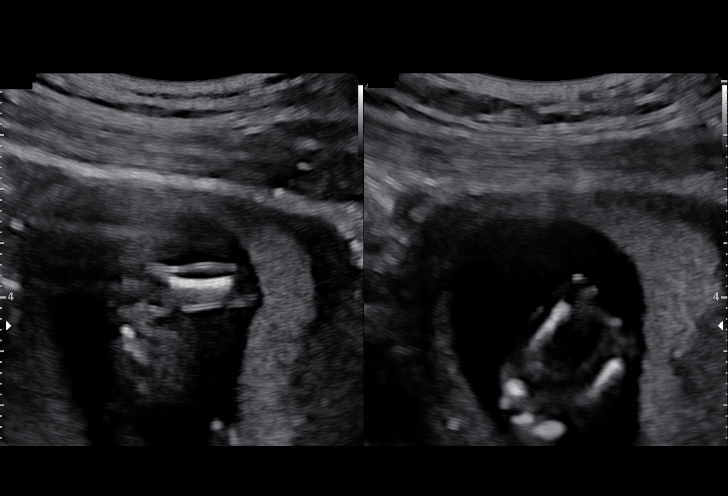
[im 17/21]
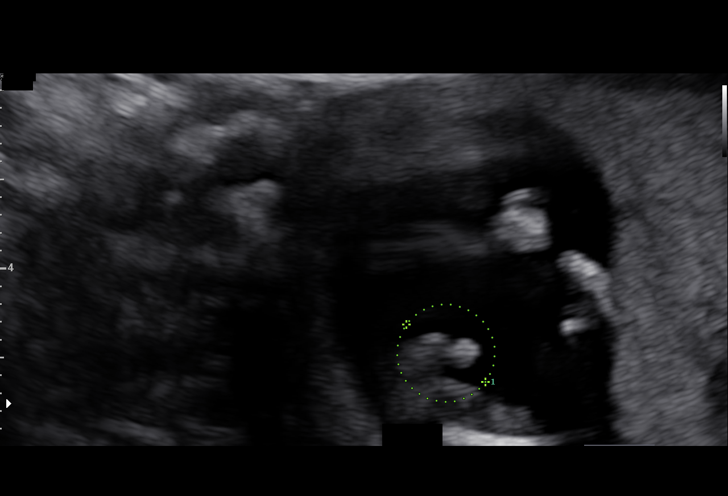
[im 18/21]
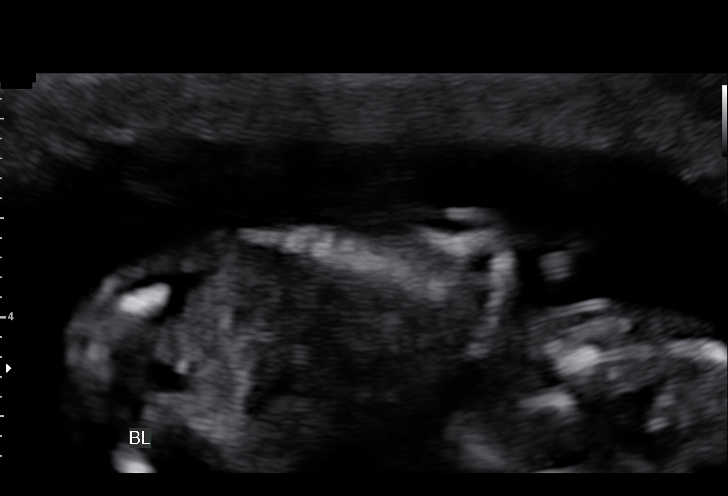
[im 19/21]
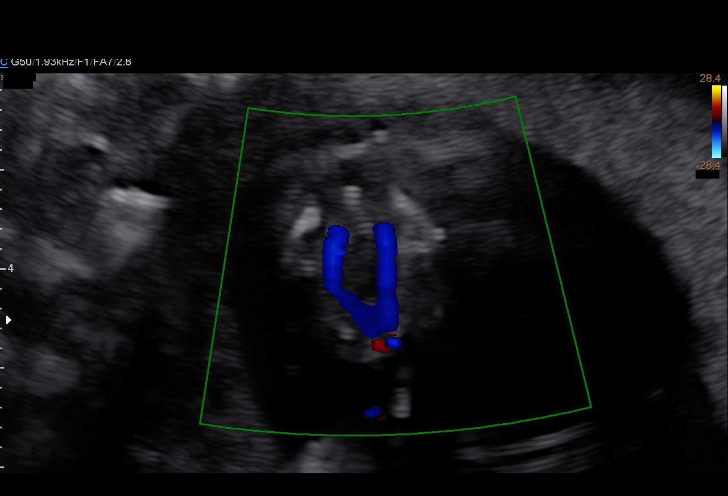
[im 21/21]
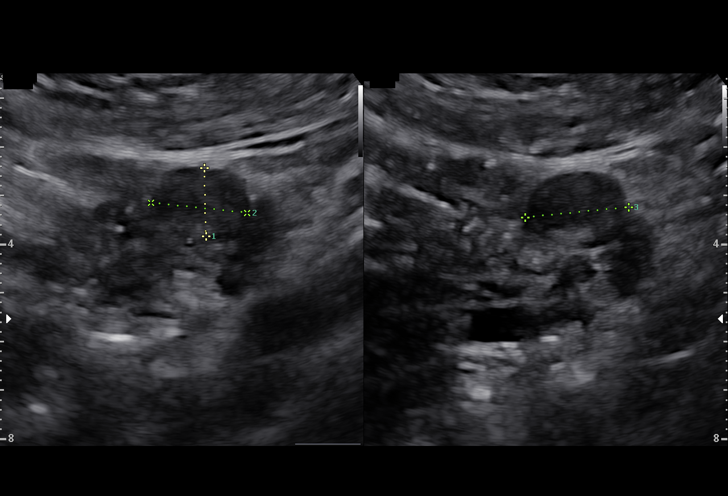

[15 of 21 positions shown; findings below may reference images not displayed]

[REDACTED]

TRANSLUCENCY

1  YUN ICE              [id number]      [id number]     [id number]
Indications

13 weeks gestation of pregnancy
Encounter for nuchal translucency
Poor obstetric history: Previous midtrimester
loss
OB History

Blood Type:            Height:  5'4"   Weight (lb):  143       BMI:
Gravidity:    3         Term:   1        Prem:   0        SAB:   1
TOP:          0       Ectopic:  0        Living: 1
Fetal Evaluation

Num Of Fetuses:     1
Preg. Location:     Intrauterine
Gest. Sac:          Intrauterine
Fetal Pole:         Visualized
Fetal Heart         146
Rate(bpm):
Cardiac Activity:   Observed
Biometry

CRL:      81.2  mm     G. Age:  13w 5d                  EDD:   05/13/16
Gestational Age

LMP:           13w 5d        Date:  08/07/15                 EDD:   05/13/16
Best:          13w 5d     Det. By:  LMP  (08/07/15)          EDD:   05/13/16
Anatomy

Cranium:               Appears normal         Upper Extremities:      Noted
Stomach:               Appears normal, left   Lower Extremities:      Noted
sided
Cord Vessels:          Appears normal (3
vessel cord)
Cervix Uterus Adnexa

Uterus
No abnormality visualized.

Left Ovary
Within normal limits.

Right Ovary
Not visualized. No adnexal mass visualized.
Impression

IUP at 13+5 weeks, referred for first trimester screening
Normal 13-14 week fetus with no gross structural anomalies,
normal cardiac activity and limb motion
NT measures 1.8mm
Recommendations

Serum analytes for first trimester screen drawn today. Plan
rpeat scan for anatomic survey in 6 weeks

## 2017-12-11 IMAGING — US US MFM OB DETAIL+14 WK
1 series · 13 of 28 positions shown · non-contrast
Comparison: none

[Series 1: us mfm ob detail+14 wk · 13 of 164 slices shown]
[im 7/164]
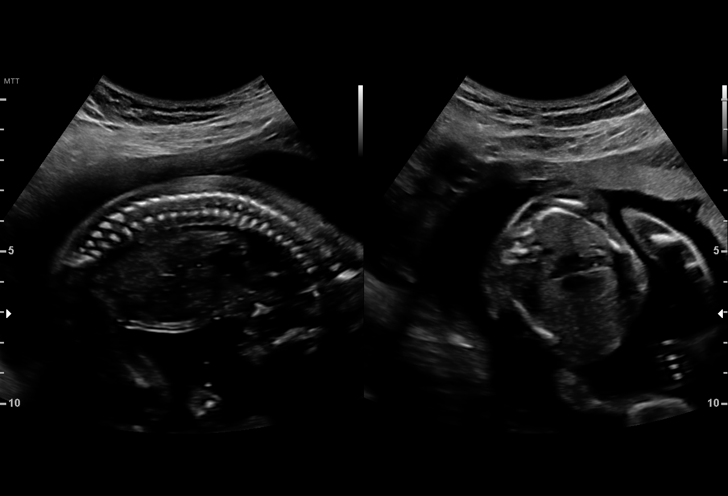
[im 19/164]
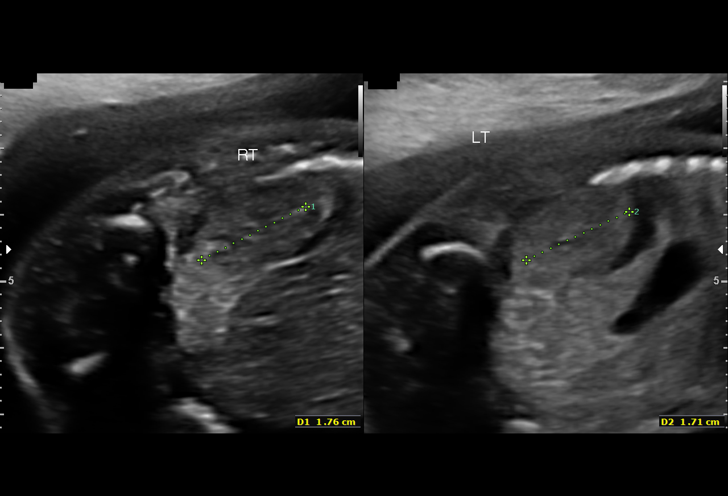
[im 31/164]
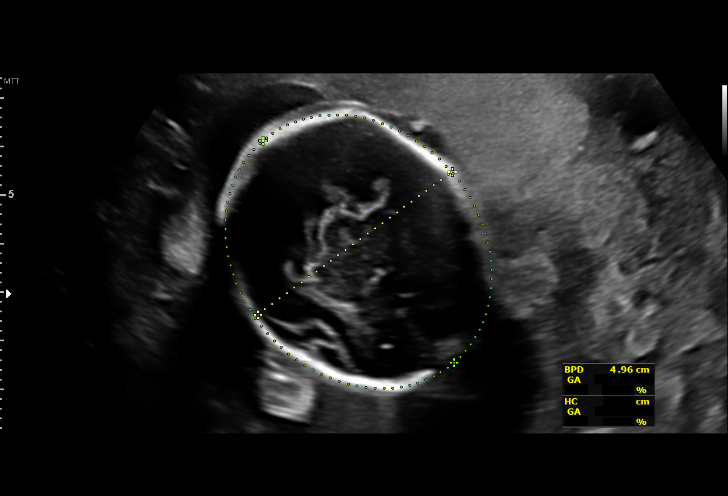
[im 43/164]
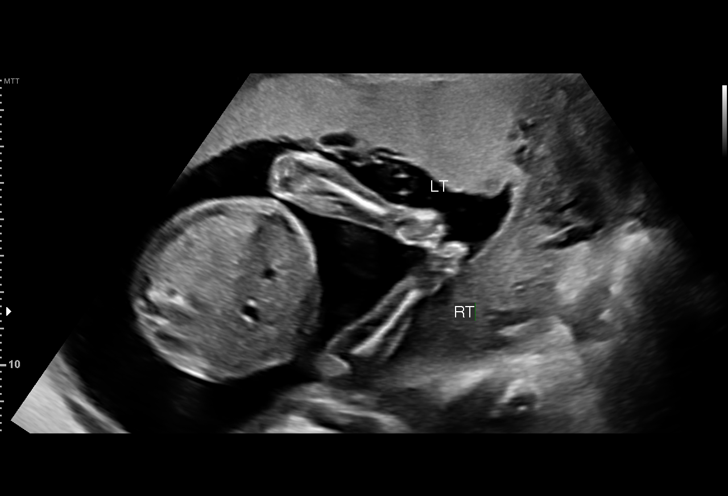
[im 55/164]
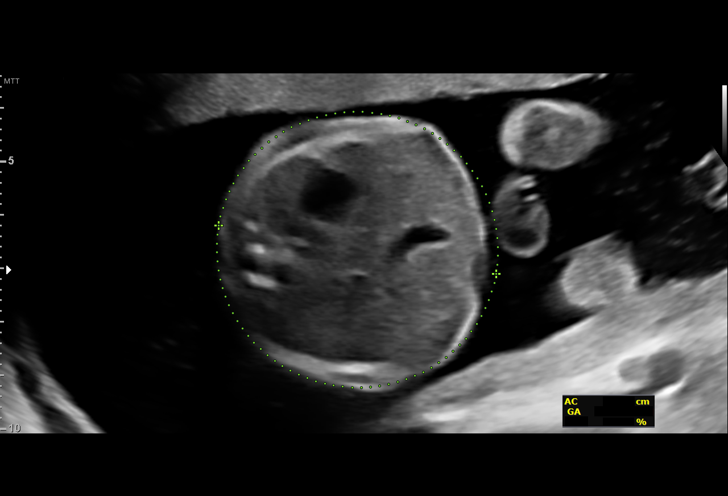
[im 67/164]
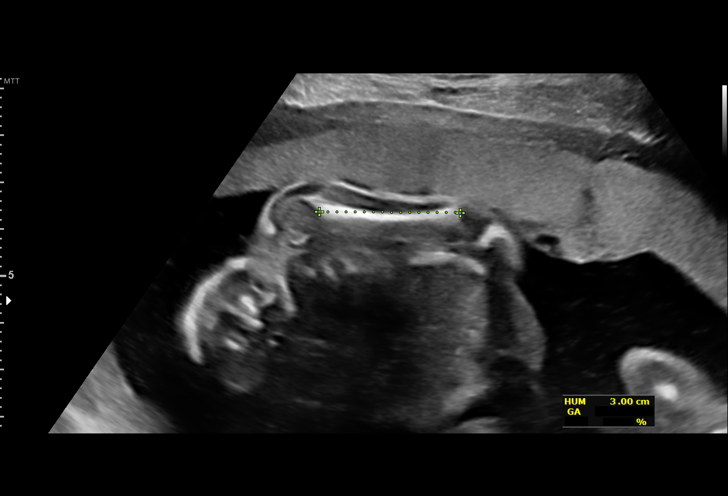
[im 85/164]
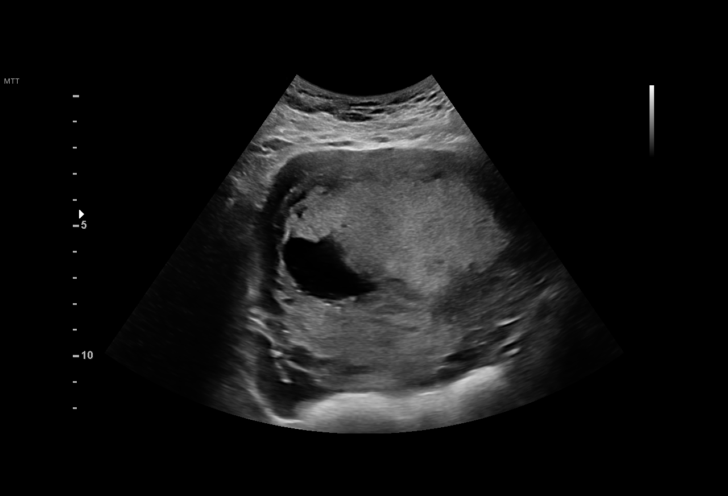
[im 97/164]
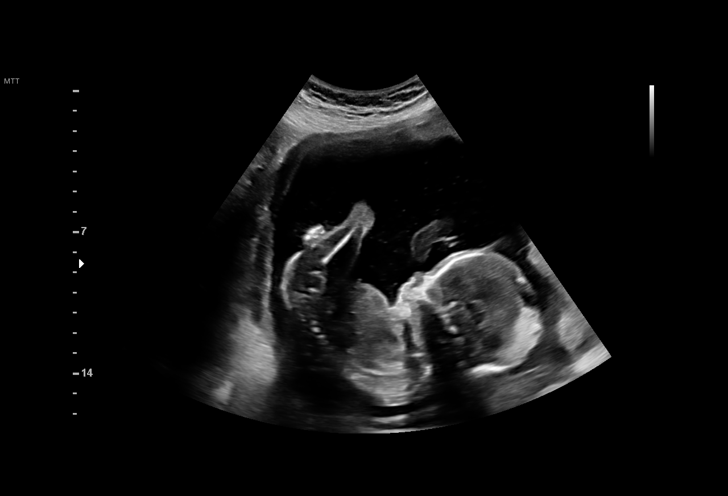
[im 109/164]
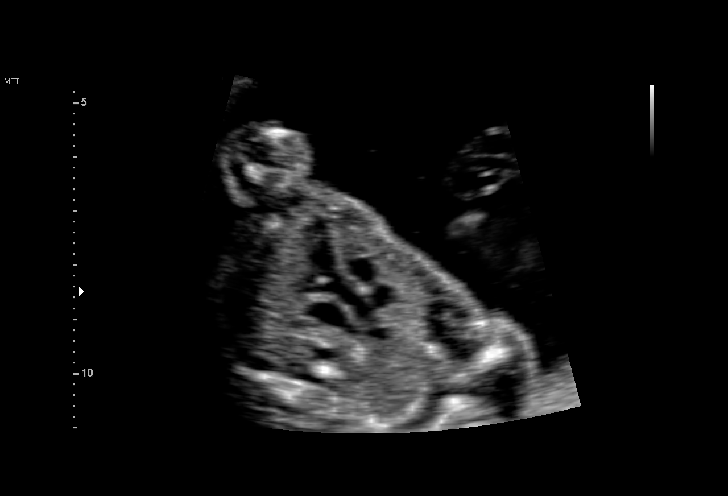
[im 121/164]
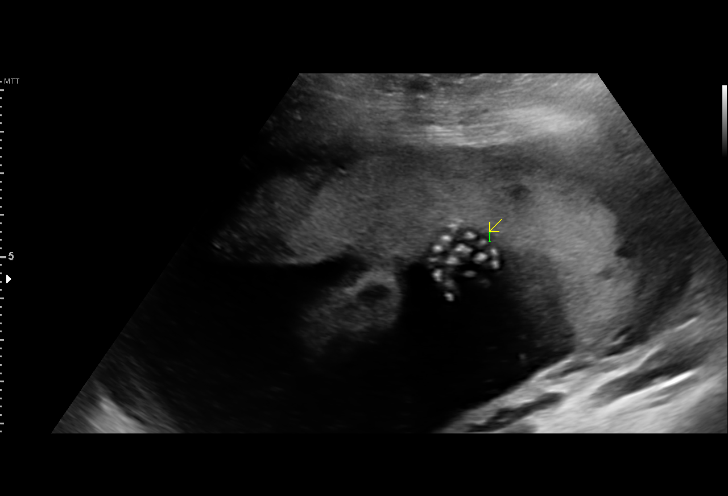
[im 133/164]
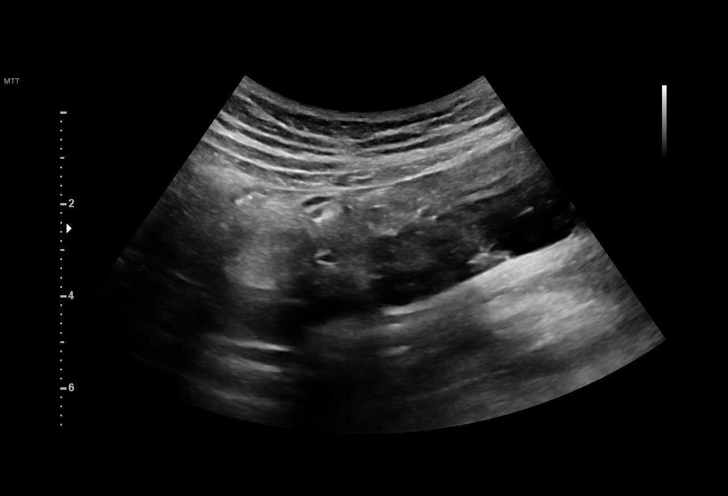
[im 145/164]
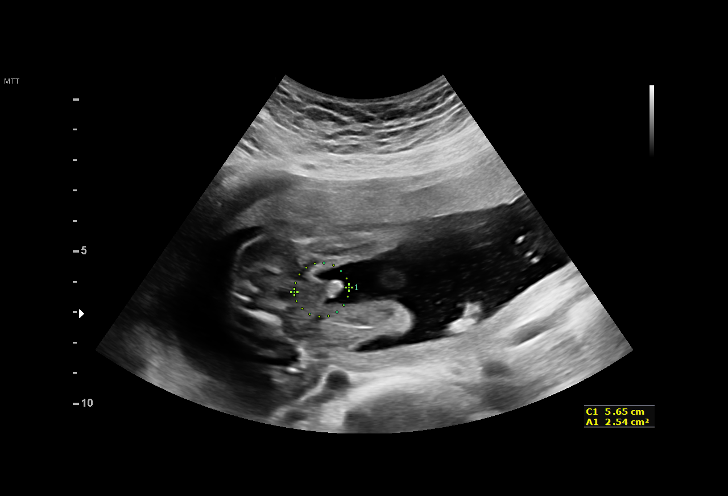
[im 157/164]
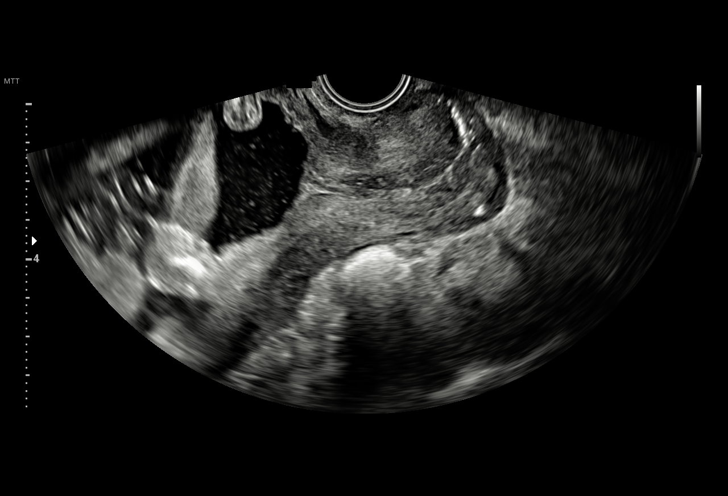

[13 of 28 positions shown; findings below may reference images not displayed]

[REDACTED]

1  REINEL JAROSZ              [id number]      [id number]     [id number]
2  PRAYOGO BANJARNEGARA            [id number]      [id number]     [id number]
Indications

19 weeks gestation of pregnancy
Poor obstetric history: Previous midtrimester
loss
Poor obstetrical history (incompetent cervix)
Antenatal screening for malformations
Tobacco use complicating pregnancy,
second trimester
OB History

Blood Type:            Height:  5'4"   Weight (lb):  143      BMI:
Gravidity:    3         Term:   1        Prem:   0        SAB:   1
TOP:          0       Ectopic:  0        Living: 1
Fetal Evaluation

Num Of Fetuses:     1
Fetal Heart         140
Rate(bpm):
Cardiac Activity:   Observed
Presentation:       Cephalic
Placenta:           Anterior, above cervical os
P. Cord Insertion:  Visualized, central

Amniotic Fluid
AFI FV:      Subjectively within normal limits

Largest Pocket(cm)
5.5
Biometry

BPD:      49.2  mm     G. Age:  20w 6d         90  %    CI:        79.42   %   70 - 86
FL/HC:      18.5   %   16.8 -
HC:      174.5  mm     G. Age:  20w 0d         55  %    HC/AC:      1.06       1.09 -
AC:      164.2  mm     G. Age:  21w 4d         91  %    FL/BPD:     65.4   %
FL:       32.2  mm     G. Age:  20w 0d         54  %    FL/AC:      19.6   %   20 - 24
HUM:      30.4  mm     G. Age:  20w 0d         61  %
CER:      20.5  mm     G. Age:  19w 4d         46  %
NFT:       4.9  mm
CM:        4.5  mm

Est. FW:     374  gm    0 lb 13 oz      62  %
Gestational Age

LMP:           19w 5d       Date:   08/07/15                 EDD:   05/13/16
U/S Today:     20w 4d                                        EDD:   05/07/16
Best:          19w 5d    Det. By:   LMP  (08/07/15)          EDD:   05/13/16
Anatomy

Cranium:               Appears normal         Aortic Arch:            Appears normal
Cavum:                 Appears normal         Ductal Arch:            Appears normal
Ventricles:            Appears normal         Diaphragm:              Appears normal
Choroid Plexus:        Appears normal         Stomach:                Appears normal, left
sided
Cerebellum:            Appears normal         Abdomen:                Appears normal
Posterior Fossa:       Appears normal         Abdominal Wall:         Appears nml (cord
insert, abd wall)
Nuchal Fold:           Appears normal         Cord Vessels:           Appears normal (3
vessel cord)
Face:                  Appears normal         Kidneys:                Appear normal
(orbits and profile)
Lips:                  Appears normal         Bladder:                Appears normal
Thoracic:              Appears normal         Spine:                  Appears normal
Heart:                 Echogenic focus        Upper Extremities:      Appears normal
in LV
RVOT:                  Appears normal         Lower Extremities:      Appears normal
LVOT:                  Appears normal

Other:  Fetus appears to be a male. Heels and 5th digit visualized. Nasal
bone visualized.
Cervix Uterus Adnexa

Cervix
Length:            3.7  cm.
Normal appearance by transvaginal scan

Uterus
No abnormality visualized.

Left Ovary
No adnexal mass visualized.

Right Ovary
No adnexal mass visualized.
Cul De Sac:   No free fluid seen.
Adnexa:       No abnormality visualized.
Impression

Singleton intrauterine pregnancy at 19+5 weeks with history
of midtrimester loss
Review of the anatomy shows  and echogenic intracardiac
focus, but no other sonographic markers for aneuploidy or
structural anomalies
Amniotic fluid volume is normal
Estimated fetal weight is 374g which is growth in the 62nd
percentile
Transvaginal cervical length is 37mm, with no funnelling and
no changes with transfundal pressure
Recommendations

Continue current treatment plan; recheck cervical length in 2
weeks

## 2017-12-24 IMAGING — US US MFM OB TRANSVAGINAL
1 series · 11 of 11 positions shown · non-contrast
Comparison: none

[Series 1: us mfm ob transvaginal · 11 of 11 slices shown]
[im 1/11]
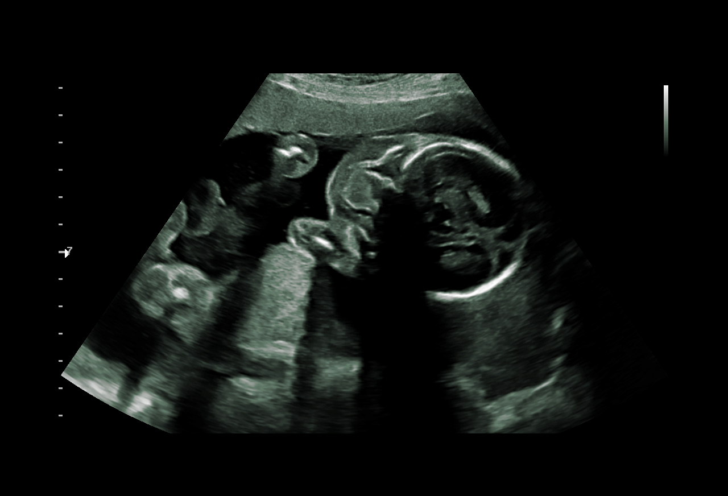
[im 2/11]
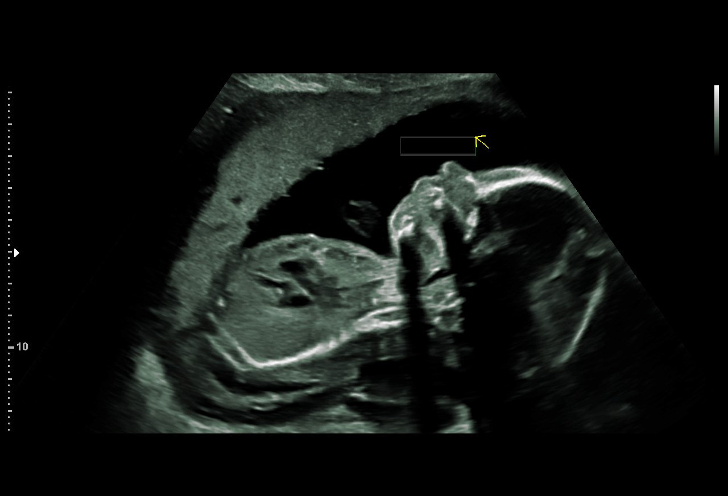
[im 3/11]
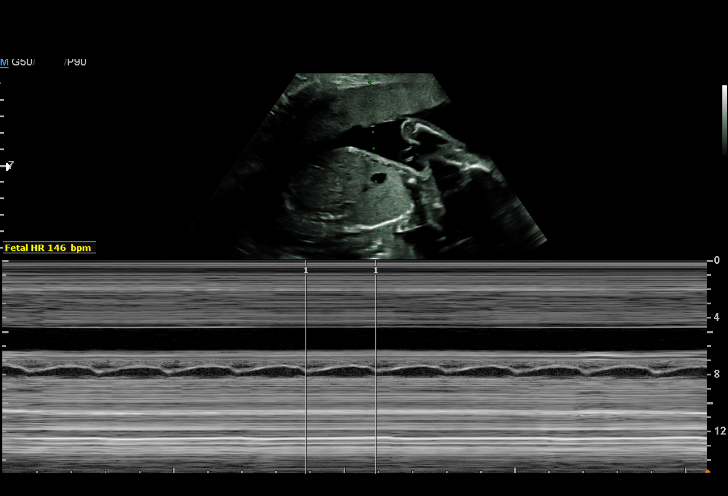
[im 4/11]
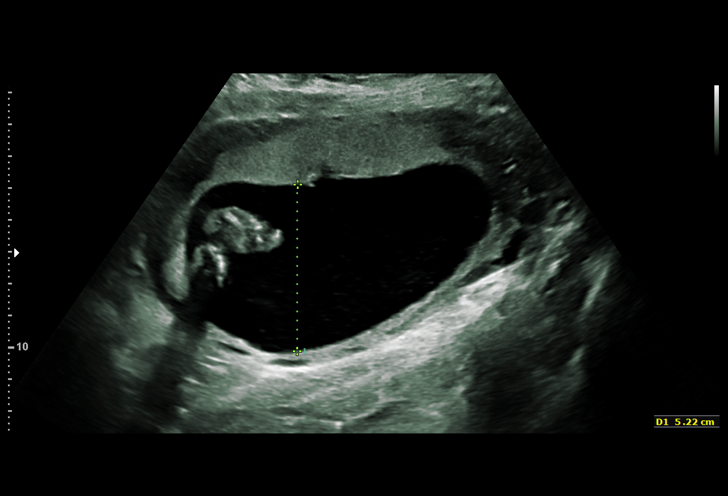
[im 5/11]
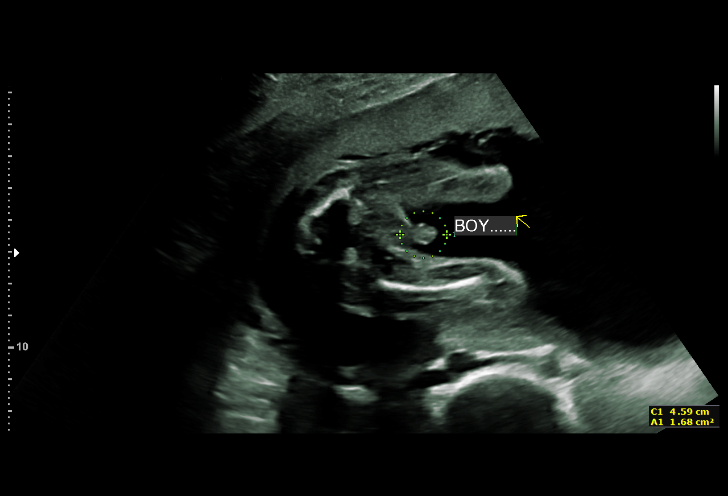
[im 6/11]
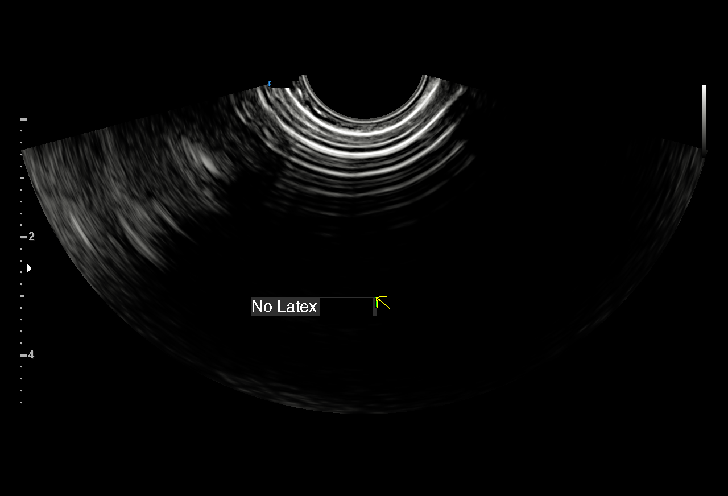
[im 7/11]
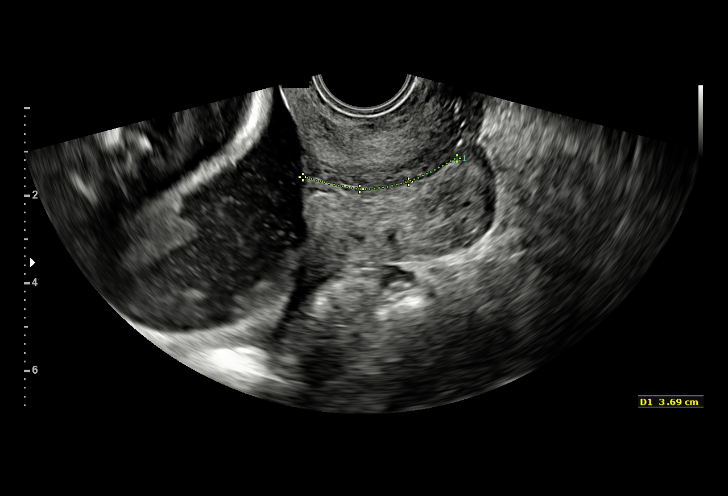
[im 8/11]
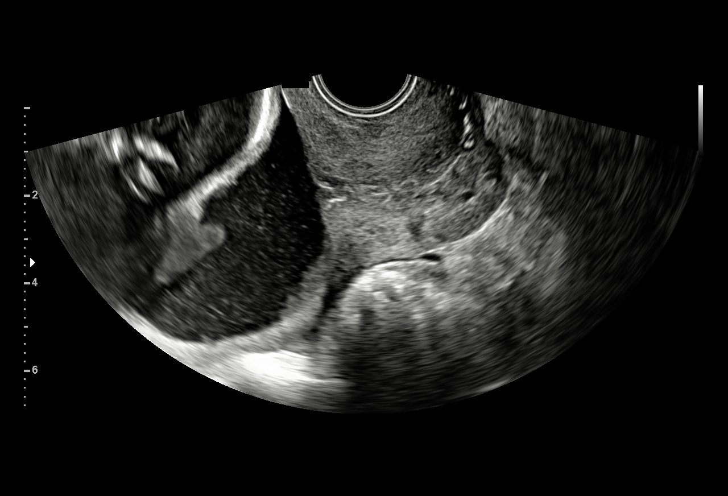
[im 9/11]
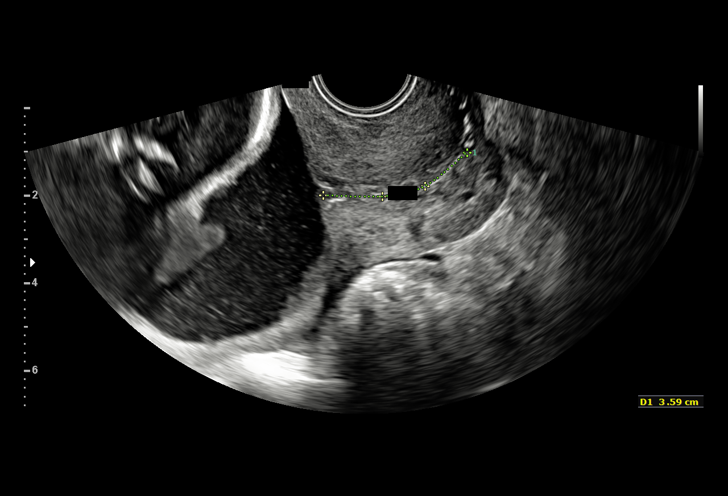
[im 10/11]
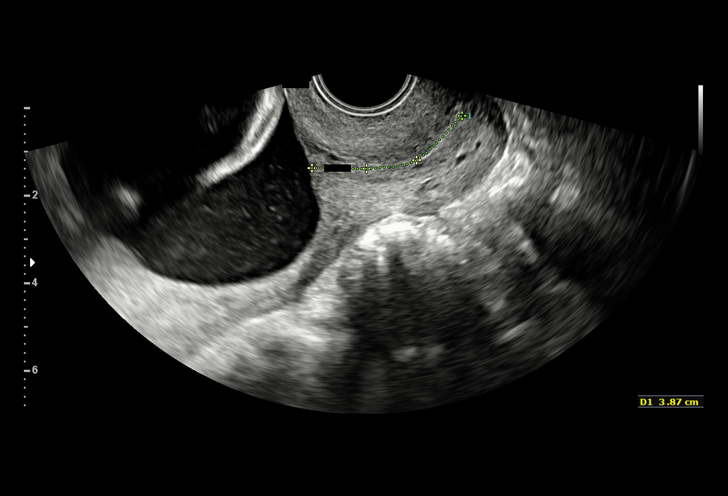
[im 11/11]
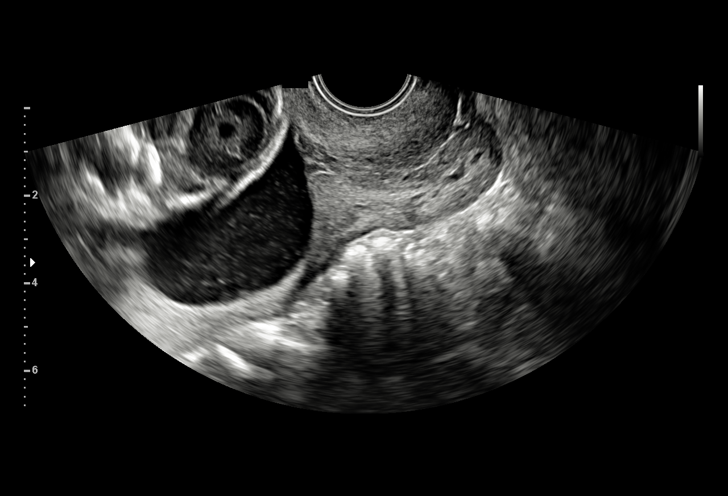

[11 of 11 positions shown; findings below may reference images not displayed]

[REDACTED]

1  C.E. PROEZA DE VIDA CAISABANDA            [id number]      [id number]     [id number]
Indications

21 weeks gestation of pregnancy
Poor obstetric history: Previous midtrimester
loss (16 weeks)
Poor obstetrical history (incompetent cervix)
Tobacco use complicating pregnancy,
second trimester
OB History

Blood Type:            Height:  5'4"   Weight (lb):  143       BMI:
Gravidity:    3         Term:   1        Prem:   0         SAB:   1
TOP:          0       Ectopic:  0        Living: 1
Fetal Evaluation

Num Of Fetuses:     1
Fetal Heart         146
Rate(bpm):
Cardiac Activity:   Observed
Presentation:       Cephalic

Amniotic Fluid
AFI FV:      Subjectively within normal limits

Largest Pocket(cm)
5.2
Gestational Age

LMP:           21w 4d        Date:  08/07/15                 EDD:    05/13/16
Best:          21w 4d     Det. By:  LMP  (08/07/15)          EDD:    05/13/16
Cervix Uterus Adnexa

Cervix
Length:            3.7  cm.
Measured transvaginally.   No change w/fundal pressure.
Impression

SIUP at 21+4 weeks
Normal amniotic fluid volume
EV views of cervix: normal length without funneling
Recommendations

Follow-up ultrasound for cervical length and growth in 2
weeks

## 2018-08-28 ENCOUNTER — Inpatient Hospital Stay (HOSPITAL_COMMUNITY): Payer: Medicaid Other

## 2018-08-28 ENCOUNTER — Inpatient Hospital Stay (HOSPITAL_COMMUNITY)
Admission: AD | Admit: 2018-08-28 | Discharge: 2018-08-28 | Disposition: A | Discharge status: Home / Self Care | Payer: Medicaid Other | Attending: Obstetrics and Gynecology | Admitting: Obstetrics and Gynecology

## 2018-08-28 ENCOUNTER — Encounter (HOSPITAL_COMMUNITY): Payer: Self-pay | Admitting: *Deleted

## 2018-08-28 ENCOUNTER — Other Ambulatory Visit: Payer: Self-pay

## 2018-08-28 DIAGNOSIS — R1032 Left lower quadrant pain: Secondary | ICD-10-CM | POA: Diagnosis not present

## 2018-08-28 DIAGNOSIS — Z79899 Other long term (current) drug therapy: Secondary | ICD-10-CM | POA: Diagnosis not present

## 2018-08-28 DIAGNOSIS — O208 Other hemorrhage in early pregnancy: Secondary | ICD-10-CM | POA: Insufficient documentation

## 2018-08-28 DIAGNOSIS — Z3A01 Less than 8 weeks gestation of pregnancy: Secondary | ICD-10-CM | POA: Diagnosis not present

## 2018-08-28 DIAGNOSIS — F17210 Nicotine dependence, cigarettes, uncomplicated: Secondary | ICD-10-CM | POA: Insufficient documentation

## 2018-08-28 DIAGNOSIS — O26899 Other specified pregnancy related conditions, unspecified trimester: Secondary | ICD-10-CM

## 2018-08-28 DIAGNOSIS — O99331 Smoking (tobacco) complicating pregnancy, first trimester: Secondary | ICD-10-CM | POA: Insufficient documentation

## 2018-08-28 DIAGNOSIS — O26891 Other specified pregnancy related conditions, first trimester: Secondary | ICD-10-CM | POA: Diagnosis not present

## 2018-08-28 DIAGNOSIS — R109 Unspecified abdominal pain: Secondary | ICD-10-CM

## 2018-08-28 DIAGNOSIS — Z3491 Encounter for supervision of normal pregnancy, unspecified, first trimester: Secondary | ICD-10-CM

## 2018-08-28 LAB — URINALYSIS, ROUTINE W REFLEX MICROSCOPIC
Bilirubin Urine: NEGATIVE
Glucose, UA: NEGATIVE mg/dL
Hgb urine dipstick: NEGATIVE
Ketones, ur: NEGATIVE mg/dL
Leukocytes,Ua: NEGATIVE
Nitrite: NEGATIVE
Protein, ur: NEGATIVE mg/dL
Specific Gravity, Urine: 1.005 (ref 1.005–1.030)
pH: 7 (ref 5.0–8.0)

## 2018-08-28 LAB — WET PREP, GENITAL
Sperm: NONE SEEN
Trich, Wet Prep: NONE SEEN
Yeast Wet Prep HPF POC: NONE SEEN

## 2018-08-28 LAB — CBC
HCT: 45 % (ref 36.0–46.0)
Hemoglobin: 14.9 g/dL (ref 12.0–15.0)
MCH: 28.5 pg (ref 26.0–34.0)
MCHC: 33.1 g/dL (ref 30.0–36.0)
MCV: 86 fL (ref 80.0–100.0)
Platelets: 187 10*3/uL (ref 150–400)
RBC: 5.23 MIL/uL — ABNORMAL HIGH (ref 3.87–5.11)
RDW: 12.8 % (ref 11.5–15.5)
WBC: 9.5 10*3/uL (ref 4.0–10.5)
nRBC: 0 % (ref 0.0–0.2)

## 2018-08-28 LAB — POCT PREGNANCY, URINE: Preg Test, Ur: POSITIVE — AB

## 2018-08-28 LAB — HCG, QUANTITATIVE, PREGNANCY: hCG, Beta Chain, Quant, S: 52376 m[IU]/mL — ABNORMAL HIGH (ref <5)

## 2018-08-28 NOTE — MAU Note (Signed)
.   Teresa Blanchard is a 28 y.o. at [redacted]w[redacted]d here in MAU reporting: lower abdominal pressure. Denies any vaginal bleeding, pt states she had a 16 week loss at home and is just worried because she is having abdominal pressure  LMP: 07/02/18 Onset of complaint: couple of days ago Pain score:7 Vitals:   08/28/18 1013 08/28/18 1014  BP:  107/69  Pulse:  84  Resp: 16 18  Temp: 98.1 F (36.7 C) 97.6 F (36.4 C)  SpO2:  100%     FHT: Lab orders placed from triage: UA/UPT

## 2018-08-28 NOTE — Discharge Instructions (Signed)
Abdominal Pain During Pregnancy ° °Belly (abdominal) pain is common during pregnancy. There are many possible causes. Most of the time, it is not a serious problem. Other times, it can be a sign that something is wrong with the pregnancy. Always tell your doctor if you have belly pain. °Follow these instructions at home: °· Do not have sex or put anything in your vagina until your pain goes away completely. °· Get plenty of rest until your pain gets better. °· Drink enough fluid to keep your pee (urine) pale yellow. °· Take over-the-counter and prescription medicines only as told by your doctor. °· Keep all follow-up visits as told by your doctor. This is important. °Contact a doctor if: °· Your pain continues or gets worse after resting. °· You have lower belly pain that: °? Comes and goes at regular times. °? Spreads to your back. °? Feels like menstrual cramps. °· You have pain or burning when you pee (urinate). °Get help right away if: °· You have a fever or chills. °· You have vaginal bleeding. °· You are leaking fluid from your vagina. °· You are passing tissue from your vagina. °· You throw up (vomit) for more than 24 hours. °· You have watery poop (diarrhea) for more than 24 hours. °· Your baby is moving less than usual. °· You feel very weak or faint. °· You have shortness of breath. °· You have very bad pain in your upper belly. °Summary °· Belly (abdominal) pain is common during pregnancy. There are many possible causes. °· If you have belly pain during pregnancy, tell your doctor right away. °· Keep all follow-up visits as told by your doctor. This is important. °This information is not intended to replace advice given to you by your health care provider. Make sure you discuss any questions you have with your health care provider. °Document Released: 12/07/2008 Document Revised: 04/08/2018 Document Reviewed: 03/23/2016 °Elsevier Patient Education © 2020 [employer] ° °

## 2018-08-28 NOTE — MAU Provider Note (Addendum)
History     CSN: [id number]  Arrival date and time: 08/28/18 [id number]   First Provider Initiated Contact with Patient 08/28/18 1056      Chief Complaint  Patient presents with  . Abdominal Pain   Teresa Blanchard is a [id number] female at 3356w1d by LMP who presents to maternity admissions reporting LLQ abdominal pain. The pain started a few days ago and has been a 6/10 since then. She states the pain is "uncomfortable" but is unable to characterize the pain further. Pain is sometimes worse with movement.  She tried a dose of tylenol which helped but did not completely resolve the pain. 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 She has felt nausea and has vomited during this pregnancy but denies any changes to this with the start of her pain. Vomitus is "yellow." She denies vaginal bleeding, vaginal discharge, urinary symptoms, or fever/chills.  She denies feeling similar pain or pressure in the past. LMP in June or July of 2020.  OB History    Gravida  4   Para  2   Term  2   Preterm      AB  1   Living  2     SAB  1   TAB      Ectopic      Multiple  0   Live Births  2           Past Medical History:  Diagnosis Date  . Medical history non-contributory     Past Surgical History:  Procedure Laterality Date  . DILATION AND CURETTAGE, DIAGNOSTIC / THERAPEUTIC      Family History  Problem Relation Age of Onset  . Breast cancer Other     Social History   Tobacco Use  . Smoking status: Light Tobacco Smoker    Packs/day: 0.25    Types: Cigarettes  . Smokeless tobacco: Never Used  Substance Use Topics  . Alcohol use: No  . Drug use: No    Allergies: No Known Allergies  Medications Prior to Admission  Medication Sig Dispense Refill Last Dose  . docusate sodium (COLACE) 100 MG capsule Take 1 capsule (100 mg total) by mouth 2 (two) times daily. 30 capsule 0   . ibuprofen (ADVIL,MOTRIN) 600 MG tablet Take 1  tablet (600 mg total) by mouth every 6 (six) hours. 30 tablet 0   . pantoprazole (PROTONIX) 20 MG tablet Take 1 tablet (20 mg total) by mouth 2 (two) times daily. 60 tablet 0   . Prenatal Vit-Fe Fumarate-FA (PRENATAL MULTIVITAMIN) TABS tablet Take 1 tablet by mouth daily at 12 noon.       Review of Systems  Constitutional: Negative for chills and fever.  Gastrointestinal: Positive for abdominal pain, nausea and vomiting. Negative for constipation and diarrhea.       LLQ abdominal pain for the past few days, nausea and vomiting throughout current pregnancy  Genitourinary: Negative for difficulty urinating, dysuria, frequency, urgency, vaginal bleeding and vaginal discharge.   Physical Exam   Blood pressure 107/69, pulse 84, temperature 97.6 F (36.4 C), resp. rate 18, height 5\' 4"  (1.626 m), weight 69.4 kg, last menstrual period 07/02/2018, SpO2 100 %, unknown if currently breastfeeding.  Physical Exam  Constitutional: She appears well-developed and well-nourished. No distress.  Respiratory: Effort normal. No respiratory distress.  GI: She exhibits no distension and no mass. There is no abdominal tenderness. There is no rebound and  no guarding.  Genitourinary:    Vagina normal.     Genitourinary Comments: Cervix pink, visually closed, without lesion, scant white creamy discharge, vaginal walls and external genitalia normal   Skin: She is not diaphoretic.   Results for orders placed or performed during the hospital encounter of 08/28/18 (from the past 24 hour(s))  Pregnancy, urine POC     Status: Abnormal   Collection Time: 08/28/18 10:13 AM  Result Value Ref Range   Preg Test, Ur POSITIVE (A) NEGATIVE  Urinalysis, Routine w reflex microscopic     Status: Abnormal   Collection Time: 08/28/18 10:16 AM  Result Value Ref Range   Color, Urine STRAW (A) YELLOW   APPearance CLEAR CLEAR   Specific Gravity, Urine 1.005 1.005 - 1.030   pH 7.0 5.0 - 8.0   Glucose, UA NEGATIVE NEGATIVE mg/dL    Hgb urine dipstick NEGATIVE NEGATIVE   Bilirubin Urine NEGATIVE NEGATIVE   Ketones, ur NEGATIVE NEGATIVE mg/dL   Protein, ur NEGATIVE NEGATIVE mg/dL   Nitrite NEGATIVE NEGATIVE   Leukocytes,Ua NEGATIVE NEGATIVE  CBC     Status: Abnormal   Collection Time: 08/28/18 11:09 AM  Result Value Ref Range   WBC 9.5 4.0 - 10.5 K/uL   RBC 5.23 (H) 3.87 - 5.11 MIL/uL   Hemoglobin 14.9 12.0 - 15.0 g/dL   HCT 45.0 36.0 - 46.0 %   MCV 86.0 80.0 - 100.0 fL   MCH 28.5 26.0 - 34.0 pg   MCHC 33.1 30.0 - 36.0 g/dL   RDW 12.8 11.5 - 15.5 %   Platelets 187 150 - 400 K/uL   nRBC 0.0 0.0 - 0.2 %  hCG, quantitative, pregnancy     Status: Abnormal   Collection Time: 08/28/18 11:09 AM  Result Value Ref Range   hCG, Beta Chain, Quant, S 52,376 (H) <5 mIU/mL  Wet prep, genital     Status: Abnormal   Collection Time: 08/28/18 11:43 AM   Specimen: Cervical/Vaginal swab  Result Value Ref Range   Yeast Wet Prep HPF POC NONE SEEN NONE SEEN   Trich, Wet Prep NONE SEEN NONE SEEN   Clue Cells Wet Prep HPF POC PRESENT (A) NONE SEEN   WBC, Wet Prep HPF POC MANY (A) NONE SEEN   Sperm NONE SEEN    US Ob Less Than 14 Weeks With Ob Transvaginal  Result Date: 08/28/2018 CLINICAL DATA:  Lower abdominal cramping and pressure. Estimated gestational age of [redacted] weeks, 1 day by LMP. EXAM: OBSTETRIC <14 WK [place] AND TRANSVAGINAL OB US TECHNIQUE: Both transabdominal and transvaginal ultrasound examinations were performed for complete evaluation of the gestation as well as the maternal uterus, adnexal regions, and pelvic cul-de-sac. Transvaginal technique was performed to assess early pregnancy. COMPARISON:  None. FINDINGS: Intrauterine gestational sac: Single Yolk sac:  Visualized. Embryo:  Visualized. Cardiac Activity: Visualized. Heart Rate: 147 bpm CRL:  9.8 mm   7 w   0 d                  [place] EDC: 04/16/2019 Subchorionic hemorrhage:  Small subchorionic hemorrhage. Maternal uterus/adnexae: Unremarkable.  Right corpus luteum. Small  amount of free fluid in the pelvis. IMPRESSION: 1. Single live intrauterine pregnancy with estimated gestational age of [redacted] weeks, 0 days. 2. Small subchorionic hemorrhage. Electronically Signed   By: Titus Dubin M.D.   On: 08/28/2018 14:21   MAU Course  Procedures  MDM  CBC: wnl Beta-HCG: 52,376 GC/Chlamydia:pending Wet prep: Clue cells, many WBC UA:  wnl  No evidence of acute abdominal process, UTI, or SAB. No ectopic pregnancy on ultrasound. Pain is likely due to physiologic stretching in pregnancy or musculoskeletal.  Assessment and Plan   1. [redacted] weeks gestation of pregnancy   2. Abdominal pain in pregnancy   3. Normal intrauterine pregnancy on prenatal ultrasound in first trimester    Discharge home with SAB precautions. F/u at new OB appointment in 3 weeks at [facility]. Recommended heating pads, massages, and Tylenol for pain.  Ann Held 08/28/2018, 11:05 AM   I confirm that I have verified the information documented in the medical student's note and that I have also personally reperformed the history, physical exam and all medical decision making activities of this service and have verified that all service and findings are accurately documented in this student's note.   Donette Larry, CNM 08/28/2018 3:00 PM

## 2018-08-29 LAB — GC/CHLAMYDIA PROBE AMP (~~LOC~~) NOT AT ARMC
Chlamydia: NEGATIVE
Neisseria Gonorrhea: NEGATIVE

## 2018-09-18 ENCOUNTER — Encounter: Payer: Self-pay | Admitting: Obstetrics & Gynecology

## 2018-09-18 DIAGNOSIS — Z3480 Encounter for supervision of other normal pregnancy, unspecified trimester: Secondary | ICD-10-CM | POA: Insufficient documentation

## 2018-09-19 ENCOUNTER — Encounter: Payer: Self-pay | Admitting: Obstetrics & Gynecology

## 2018-09-19 ENCOUNTER — Ambulatory Visit (INDEPENDENT_AMBULATORY_CARE_PROVIDER_SITE_OTHER): Payer: Medicaid Other | Admitting: Obstetrics & Gynecology

## 2018-09-19 ENCOUNTER — Other Ambulatory Visit (HOSPITAL_COMMUNITY)
Admission: RE | Admit: 2018-09-19 | Discharge: 2018-09-19 | Disposition: A | Discharge status: Home / Self Care | Payer: Medicaid Other | Source: Ambulatory Visit | Attending: Obstetrics & Gynecology | Admitting: Obstetrics & Gynecology

## 2018-09-19 ENCOUNTER — Other Ambulatory Visit (HOSPITAL_COMMUNITY): Payer: Self-pay | Admitting: Obstetrics & Gynecology

## 2018-09-19 ENCOUNTER — Other Ambulatory Visit: Payer: Medicaid Other

## 2018-09-19 ENCOUNTER — Other Ambulatory Visit: Payer: Self-pay

## 2018-09-19 DIAGNOSIS — Z3A10 10 weeks gestation of pregnancy: Secondary | ICD-10-CM

## 2018-09-19 DIAGNOSIS — Z3480 Encounter for supervision of other normal pregnancy, unspecified trimester: Secondary | ICD-10-CM | POA: Insufficient documentation

## 2018-09-19 DIAGNOSIS — Z3481 Encounter for supervision of other normal pregnancy, first trimester: Secondary | ICD-10-CM

## 2018-09-19 MED ORDER — BLOOD PRESSURE KIT DEVI: 1.0000 | 0 refills | Status: DC

## 2018-09-19 NOTE — Progress Notes (Signed)
  Subjective:    Cloria Ciresi isa 28 yo single G4P2 (2 and 2 yo kids)  being seen today for her first obstetrical visit.  This is not a planned pregnancy. She is at [redacted]w[redacted]d gestation. Her obstetrical history is significant for smoking- I have rec'd that she stop this.. Relationship with FOB: spouse, living together. Patient does intend to breast feed. Pregnancy history fully reviewed.  Patient reports no complaints.  Review of Systems:   Review of Systems Works at [employer], courier  Objective:     BP 109/68   Wt 158 lb (71.7 kg)   LMP 07/02/2018   BMI 27.12 kg/m  Physical Exam  Exam Breathing, conversing, and ambulating normally Well nourished, well hydrated White female, no apparent distress Heart- rrr Lungs- CTAB Abd- benign Spec exam- frothy vaginal discharge (wet prep sent)   Assessment:    Pregnancy: [id number] Patient Active Problem List   Diagnosis Date Noted  . Supervision of other normal pregnancy, antepartum 09/18/2018  . NSVD (normal spontaneous vaginal delivery) 05/15/2016  . Uterine contractions during pregnancy 05/14/2016  . Gastroesophageal reflux disease 02/04/2016  . Prior perinatal loss in second trimester, antepartum 01/07/2016  . History of 1 spontaneous abortion 10/29/2015       Plan:     Initial labs drawn. Prenatal vitamins. Problem list reviewed and updated. NIPS in 2 weeks Role of ultrasound in pregnancy discussed; fetal survey: ordered. She declines virtual visits Come back for ROB in 6 weeks    Parish Dubose C T[place] Bossi 09/19/2018

## 2018-09-20 LAB — OBSTETRIC PANEL
Absolute Monocytes: 528 cells/uL (ref 200–950)
Antibody Screen: NOT DETECTED
Basophils Absolute: 44 cells/uL (ref 0–200)
Basophils Relative: 0.4 %
Eosinophils Absolute: 88 cells/uL (ref 15–500)
Eosinophils Relative: 0.8 %
HCT: 42.5 % (ref 35.0–45.0)
Hemoglobin: 14.8 g/dL (ref 11.7–15.5)
Hepatitis B Surface Ag: NONREACTIVE
Lymphs Abs: 2332 cells/uL (ref 850–3900)
MCH: 29.1 pg (ref 27.0–33.0)
MCHC: 34.8 g/dL (ref 32.0–36.0)
MCV: 83.7 fL (ref 80.0–100.0)
MPV: 11.6 fL (ref 7.5–12.5)
Monocytes Relative: 4.8 %
Neutro Abs: 8008 cells/uL — ABNORMAL HIGH (ref 1500–7800)
Neutrophils Relative %: 72.8 %
Platelets: 155 10*3/uL (ref 140–400)
RBC: 5.08 10*6/uL (ref 3.80–5.10)
RDW: 12.7 % (ref 11.0–15.0)
RPR Ser Ql: NONREACTIVE
Rubella: 6.24 index
Total Lymphocyte: 21.2 %
WBC: 11 10*3/uL — ABNORMAL HIGH (ref 3.8–10.8)

## 2018-09-20 LAB — CERVICOVAGINAL ANCILLARY ONLY
Bacterial Vaginitis (gardnerella): POSITIVE — AB
Candida Glabrata: NEGATIVE
Candida Vaginitis: POSITIVE — AB
Molecular Disclaimer: NEGATIVE
Molecular Disclaimer: NEGATIVE
Molecular Disclaimer: NORMAL

## 2018-09-20 LAB — HEMOGLOBIN A1C
Hgb A1c MFr Bld: 5.1 % of total Hgb (ref <5.7)
Mean Plasma Glucose: 100 (calc)
eAG (mmol/L): 5.5 (calc)

## 2018-09-20 LAB — HIV ANTIBODY (ROUTINE TESTING W REFLEX): HIV 1&2 Ab, 4th Generation: NONREACTIVE

## 2018-09-21 ENCOUNTER — Other Ambulatory Visit: Payer: Self-pay | Admitting: Obstetrics & Gynecology

## 2018-09-21 LAB — URINE CULTURE, OB REFLEX

## 2018-09-21 LAB — CERVICOVAGINAL ANCILLARY ONLY
Chlamydia: POSITIVE — AB
Neisseria Gonorrhea: NEGATIVE

## 2018-09-21 LAB — CULTURE, OB URINE

## 2018-09-21 MED ORDER — METRONIDAZOLE 500 MG PO TABS: 500.0000 mg | ORAL_TABLET | Freq: Two times a day (BID) | ORAL | 0 refills | Status: DC

## 2018-09-21 MED ORDER — FLUCONAZOLE 150 MG PO TABS: 150.0000 mg | ORAL_TABLET | Freq: Once | ORAL | 3 refills | Status: AC

## 2018-09-21 NOTE — Progress Notes (Signed)
Diflucan and flagyl prescribed

## 2018-09-21 NOTE — Progress Notes (Signed)
Diflucan and flagyl for yeast and bv.

## 2018-09-23 LAB — CYTOLOGY - PAP: Diagnosis: NEGATIVE

## 2018-09-25 ENCOUNTER — Other Ambulatory Visit: Payer: Self-pay

## 2018-09-25 ENCOUNTER — Telehealth: Payer: Self-pay

## 2018-09-25 DIAGNOSIS — A749 Chlamydial infection, unspecified: Secondary | ICD-10-CM

## 2018-09-25 MED ORDER — AZITHROMYCIN 250 MG PO TABS: 1000.0000 mg | ORAL_TABLET | Freq: Once | ORAL | 0 refills | Status: AC

## 2018-09-25 MED ORDER — TERCONAZOLE 0.4 % VA CREA: 1.0000 | TOPICAL_CREAM | Freq: Every day | VAGINAL | 0 refills | Status: DC

## 2018-09-25 NOTE — Telephone Encounter (Signed)
Called pt to advise that Zithromax was sent to Pharmacy on file for Chlamydia & Terconazole cream was sent for Yeast Infection, no answer, VM not set up. Will retry later.

## 2018-09-28 DIAGNOSIS — Z3400 Encounter for supervision of normal first pregnancy, unspecified trimester: Secondary | ICD-10-CM | POA: Diagnosis not present

## 2018-10-03 ENCOUNTER — Other Ambulatory Visit: Payer: Self-pay

## 2018-10-03 ENCOUNTER — Ambulatory Visit: Payer: Medicaid Other | Admitting: *Deleted

## 2018-10-03 ENCOUNTER — Encounter: Payer: Self-pay | Admitting: *Deleted

## 2018-10-03 DIAGNOSIS — Z3482 Encounter for supervision of other normal pregnancy, second trimester: Secondary | ICD-10-CM | POA: Diagnosis not present

## 2018-10-11 DIAGNOSIS — Z3480 Encounter for supervision of other normal pregnancy, unspecified trimester: Secondary | ICD-10-CM

## 2018-10-15 ENCOUNTER — Telehealth: Payer: Self-pay

## 2018-10-15 NOTE — Telephone Encounter (Signed)
Pt called office earlier with questions about genetic testing. I attempted to call pt back. Pt did not answer and no voicemail box available.

## 2018-10-31 ENCOUNTER — Ambulatory Visit (INDEPENDENT_AMBULATORY_CARE_PROVIDER_SITE_OTHER): Payer: Medicaid Other | Admitting: Obstetrics & Gynecology

## 2018-10-31 ENCOUNTER — Other Ambulatory Visit: Payer: Self-pay

## 2018-10-31 DIAGNOSIS — Z3482 Encounter for supervision of other normal pregnancy, second trimester: Secondary | ICD-10-CM

## 2018-10-31 DIAGNOSIS — Z3A16 16 weeks gestation of pregnancy: Secondary | ICD-10-CM

## 2018-10-31 DIAGNOSIS — Z3480 Encounter for supervision of other normal pregnancy, unspecified trimester: Secondary | ICD-10-CM

## 2018-10-31 NOTE — Progress Notes (Signed)
   PRENATAL VISIT NOTE  Subjective:  Teresa Blanchard is a 28 y.o. [phone] at [redacted]w[redacted]d being seen today for ongoing prenatal care.  She is currently monitored for the following issues for this low-risk pregnancy and has History of 1 spontaneous abortion; Prior perinatal loss in second trimester, antepartum; Gastroesophageal reflux disease; Uterine contractions during pregnancy; NSVD (normal spontaneous vaginal delivery); and Supervision of other normal pregnancy, antepartum on their problem list.  Patient reports no complaints.  Contractions: Irritability. Vag. Bleeding: None.  Movement: Absent. Denies leaking of fluid.   The following portions of the patient's history were reviewed and updated as appropriate: allergies, current medications, past family history, past medical history, past social history, past surgical history and problem list.   Objective:   Vitals:   10/31/18 1328  BP: 119/77  Pulse: 84  Weight: 156 lb (70.8 kg)    Fetal Status: Fetal Heart Rate (bpm): 155   Movement: Absent     General:  Alert, oriented and cooperative. Patient is in no acute distress.  Skin: Skin is warm and dry. No rash noted.   Cardiovascular: Normal heart rate noted  Respiratory: Normal respiratory effort, no problems with respiration noted  Abdomen: Soft, gravid, appropriate for gestational age.  Pain/Pressure: Absent     Pelvic: Cervical exam deferred        Extremities: Normal range of motion.     Mental Status: Normal mood and affect. Normal behavior. Normal judgment and thought content.   Assessment and Plan:  Pregnancy: [id number] at [redacted]w[redacted]d 1. Supervision of other normal pregnancy, antepartum - MFM anatomy u/s on 11-14-18  Preterm labor symptoms and general obstetric precautions including but not limited to vaginal bleeding, contractions, leaking of fluid and fetal movement were reviewed in detail with the patient. Please refer to After Visit Summary for other counseling recommendations.    Return in about 5 weeks (around 12/05/2018) for in person.  Future Appointments  Date Time Provider [place]  11/14/2018  2:30 PM [facility] [place] 1 [facility]    Emily Filbert, MD

## 2018-11-07 NOTE — Progress Notes (Signed)
Erroneous encounter

## 2018-11-14 ENCOUNTER — Other Ambulatory Visit: Payer: Self-pay | Admitting: Obstetrics & Gynecology

## 2018-11-14 ENCOUNTER — Other Ambulatory Visit: Payer: Self-pay

## 2018-11-14 ENCOUNTER — Ambulatory Visit (HOSPITAL_COMMUNITY)
Admission: RE | Admit: 2018-11-14 | Discharge: 2018-11-14 | Disposition: A | Discharge status: Home / Self Care | Payer: Medicaid Other | Source: Ambulatory Visit | Attending: Obstetrics and Gynecology | Admitting: Obstetrics and Gynecology

## 2018-11-14 DIAGNOSIS — Z3A18 18 weeks gestation of pregnancy: Secondary | ICD-10-CM

## 2018-11-14 DIAGNOSIS — O3432 Maternal care for cervical incompetence, second trimester: Secondary | ICD-10-CM

## 2018-11-14 DIAGNOSIS — Z3480 Encounter for supervision of other normal pregnancy, unspecified trimester: Secondary | ICD-10-CM

## 2018-11-14 DIAGNOSIS — Z363 Encounter for antenatal screening for malformations: Secondary | ICD-10-CM

## 2018-11-15 ENCOUNTER — Other Ambulatory Visit (HOSPITAL_COMMUNITY): Payer: Self-pay | Admitting: *Deleted

## 2018-11-15 DIAGNOSIS — Z362 Encounter for other antenatal screening follow-up: Secondary | ICD-10-CM

## 2018-12-05 ENCOUNTER — Other Ambulatory Visit: Payer: Self-pay

## 2018-12-05 ENCOUNTER — Ambulatory Visit (INDEPENDENT_AMBULATORY_CARE_PROVIDER_SITE_OTHER): Payer: Medicaid Other | Admitting: Obstetrics and Gynecology

## 2018-12-05 ENCOUNTER — Encounter: Payer: Self-pay | Admitting: Obstetrics and Gynecology

## 2018-12-05 VITALS — BP 114/66 | HR 94 | Wt 159.0 lb

## 2018-12-05 DIAGNOSIS — Z3480 Encounter for supervision of other normal pregnancy, unspecified trimester: Secondary | ICD-10-CM | POA: Diagnosis not present

## 2018-12-05 DIAGNOSIS — Z3A21 21 weeks gestation of pregnancy: Secondary | ICD-10-CM

## 2018-12-05 DIAGNOSIS — Z3482 Encounter for supervision of other normal pregnancy, second trimester: Secondary | ICD-10-CM

## 2018-12-05 MED ORDER — PANTOPRAZOLE SODIUM 20 MG PO TBEC: 20.0000 mg | DELAYED_RELEASE_TABLET | Freq: Every day | ORAL | 3 refills | Status: DC

## 2018-12-05 NOTE — Progress Notes (Signed)
   PRENATAL VISIT NOTE  Subjective:  Teresa Blanchard is a 28 y.o. [phone] at [redacted]w[redacted]d being seen today for ongoing prenatal care.  She is currently monitored for the following issues for this low-risk pregnancy and has History of 1 spontaneous abortion; Prior perinatal loss in second trimester, antepartum; Gastroesophageal reflux disease; Uterine contractions during pregnancy; NSVD (normal spontaneous vaginal delivery); and Supervision of other normal pregnancy, antepartum on their problem list.  Patient reports heartburn.  Contractions: Irritability. Vag. Bleeding: None.  Movement: Present. Denies leaking of fluid.   The following portions of the patient's history were reviewed and updated as appropriate: allergies, current medications, past family history, past medical history, past social history, past surgical history and problem list.   Objective:   Vitals:   12/05/18 1342  BP: 114/66  Pulse: 94  Weight: 159 lb (72.1 kg)    Fetal Status: Fetal Heart Rate (bpm): 141 Fundal Height: 22 cm Movement: Present     General:  Alert, oriented and cooperative. Patient is in no acute distress.  Skin: Skin is warm and dry. No rash noted.   Cardiovascular: Normal heart rate noted  Respiratory: Normal respiratory effort, no problems with respiration noted  Abdomen: Soft, gravid, appropriate for gestational age.  Pain/Pressure: Absent     Pelvic: Cervical exam deferred        Extremities: Normal range of motion.  Edema: Trace  Mental Status: Normal mood and affect. Normal behavior. Normal judgment and thought content.   Assessment and Plan:  Pregnancy: [id number] at [redacted]w[redacted]d 1. Supervision of other normal pregnancy, antepartum Patient is doing well without complaints Rx protonix provided AFP today Follow up incomplete anatomy next week Third trimester labs next visit - AFP only  Preterm labor symptoms and general obstetric precautions including but not limited to vaginal bleeding, contractions,  leaking of fluid and fetal movement were reviewed in detail with the patient. Please refer to After Visit Summary for other counseling recommendations.   Return in about 5 weeks (around 01/09/2019) for in person, ROB, 2 hr glucola next visit.  Future Appointments  Date Time Provider [place]  12/12/2018 12:45 PM [place] 5 [facility]  12/12/2018 12:55 PM [place] [facility]  01/06/2019  9:00 AM [place], Fredderick [place]ix, MD [facility]    Mora Bellman, MD

## 2018-12-06 ENCOUNTER — Other Ambulatory Visit: Payer: Self-pay

## 2018-12-06 LAB — ALPHA FETOPROTEIN, MATERNAL
AFP MoM: 2.22
AFP, Serum: 159.8 ng/mL
Calc'd Gestational Age: 21.9 weeks
Maternal Wt: 158 [lb_av]
Risk for ONTD: 1
Twins-AFP: 1

## 2018-12-09 ENCOUNTER — Telehealth: Payer: Self-pay

## 2018-12-09 NOTE — Telephone Encounter (Signed)
Pt tested positive for BV and yeast in September. Pt calls today to say that her medications were sent to the wrong pharmacy. I instructed pt to come in and do a self swab considering it has been so long since she tested positive. Pt states she will call on Thursday and come in.

## 2018-12-12 ENCOUNTER — Ambulatory Visit (HOSPITAL_COMMUNITY): Payer: Medicaid Other | Admitting: *Deleted

## 2018-12-12 ENCOUNTER — Other Ambulatory Visit (HOSPITAL_COMMUNITY): Payer: Self-pay | Admitting: Obstetrics

## 2018-12-12 ENCOUNTER — Encounter (HOSPITAL_COMMUNITY): Payer: Self-pay

## 2018-12-12 ENCOUNTER — Ambulatory Visit (HOSPITAL_COMMUNITY)
Admission: RE | Admit: 2018-12-12 | Discharge: 2018-12-12 | Disposition: A | Discharge status: Home / Self Care | Payer: Medicaid Other | Source: Ambulatory Visit | Attending: Obstetrics and Gynecology | Admitting: Obstetrics and Gynecology

## 2018-12-12 ENCOUNTER — Ambulatory Visit (INDEPENDENT_AMBULATORY_CARE_PROVIDER_SITE_OTHER): Payer: Medicaid Other

## 2018-12-12 ENCOUNTER — Other Ambulatory Visit (HOSPITAL_COMMUNITY)
Admission: RE | Admit: 2018-12-12 | Discharge: 2018-12-12 | Disposition: A | Discharge status: Home / Self Care | Payer: Medicaid Other | Source: Ambulatory Visit | Attending: Obstetrics & Gynecology | Admitting: Obstetrics & Gynecology

## 2018-12-12 ENCOUNTER — Other Ambulatory Visit: Payer: Self-pay

## 2018-12-12 VITALS — BP 112/60 | HR 72 | Temp 97.8°F

## 2018-12-12 DIAGNOSIS — Z362 Encounter for other antenatal screening follow-up: Secondary | ICD-10-CM | POA: Insufficient documentation

## 2018-12-12 DIAGNOSIS — O0990 Supervision of high risk pregnancy, unspecified, unspecified trimester: Secondary | ICD-10-CM | POA: Insufficient documentation

## 2018-12-12 DIAGNOSIS — N760 Acute vaginitis: Secondary | ICD-10-CM

## 2018-12-12 DIAGNOSIS — B9689 Other specified bacterial agents as the cause of diseases classified elsewhere: Secondary | ICD-10-CM

## 2018-12-12 DIAGNOSIS — Z3A22 22 weeks gestation of pregnancy: Secondary | ICD-10-CM

## 2018-12-12 DIAGNOSIS — Z3480 Encounter for supervision of other normal pregnancy, unspecified trimester: Secondary | ICD-10-CM | POA: Insufficient documentation

## 2018-12-12 DIAGNOSIS — O3432 Maternal care for cervical incompetence, second trimester: Secondary | ICD-10-CM

## 2018-12-12 DIAGNOSIS — B379 Candidiasis, unspecified: Secondary | ICD-10-CM

## 2018-12-12 DIAGNOSIS — O09292 Supervision of pregnancy with other poor reproductive or obstetric history, second trimester: Secondary | ICD-10-CM

## 2018-12-12 NOTE — Progress Notes (Signed)
Pt tested positive for BV and yeast 09/19/18 and never picked up medications. Pt went to get meds last week but pharmacy didn't have Rx anymore. Pt here to retest to see if she still has yeast and BV. Aptima done.

## 2018-12-13 LAB — CERVICOVAGINAL ANCILLARY ONLY
Bacterial Vaginitis (gardnerella): POSITIVE — AB
Candida Glabrata: NEGATIVE
Candida Vaginitis: NEGATIVE
Comment: NEGATIVE
Comment: NEGATIVE
Comment: NEGATIVE

## 2018-12-16 ENCOUNTER — Telehealth: Payer: Self-pay

## 2018-12-16 DIAGNOSIS — N760 Acute vaginitis: Secondary | ICD-10-CM

## 2018-12-16 DIAGNOSIS — B9689 Other specified bacterial agents as the cause of diseases classified elsewhere: Secondary | ICD-10-CM

## 2018-12-16 MED ORDER — METRONIDAZOLE 500 MG PO TABS: 500.0000 mg | ORAL_TABLET | Freq: Two times a day (BID) | ORAL | 0 refills | Status: DC

## 2018-12-16 NOTE — Telephone Encounter (Addendum)
Spoke with pt and she is aware of positive BV. Flagyl sent to pharmacy per Dr.Dove.  ----- Message from Emily Filbert, MD sent at 12/16/2018  9:58 AM EST ----- Please let her know that she needs treated (again) for BV. Can you please call in flagyl? Thanks

## 2018-12-18 ENCOUNTER — Emergency Department
Admission: EM | Admit: 2018-12-18 | Discharge: 2018-12-18 | Disposition: A | Discharge status: Home / Self Care | Payer: Medicaid Other | Source: Home / Self Care | Attending: Internal Medicine | Admitting: Internal Medicine

## 2018-12-18 ENCOUNTER — Other Ambulatory Visit: Payer: Self-pay

## 2018-12-18 DIAGNOSIS — J209 Acute bronchitis, unspecified: Secondary | ICD-10-CM

## 2018-12-18 MED ORDER — ALBUTEROL SULFATE HFA 108 (90 BASE) MCG/ACT IN AERS: 1.0000 | INHALATION_SPRAY | Freq: Four times a day (QID) | RESPIRATORY_TRACT | 0 refills | Status: DC | PRN

## 2018-12-18 MED ORDER — PREDNISONE 10 MG PO TABS: 20.0000 mg | ORAL_TABLET | Freq: Every day | ORAL | 0 refills | Status: AC

## 2018-12-18 NOTE — ED Triage Notes (Signed)
Last night had coughing, and today chest tightness.  Pt denies fever

## 2018-12-18 NOTE — ED Provider Notes (Signed)
[facility]    CSN: [id number] Arrival date & time: 12/18/18  1623      History   Chief Complaint Chief Complaint  Patient presents with  . Cough  . chest tightness    HPI Teresa Blanchard is a 28 y.o. female with no past medical history currently [redacted] weeks pregnant comes to urgent care with complaints of 1 day history of nonproductive cough, chest tightness and wheezing.  Symptoms started yesterday and has been persistent.  She denies any upper respiratory infection symptoms.  No chest pain or chest pressure.  No loss of taste or smell.  No sick contacts.  No fever or chills.  Patient has a history of smoking cigarettes used to smoke a pack of cigarettes a day.  She has not tried any over-the-counter remedies.  HPI  Past Medical History:  Diagnosis Date  . GERD (gastroesophageal reflux disease)   . Medical history non-contributory   . Supervision of high risk pregnancy, antepartum 10/29/2015    Clinic  [facility] Prenatal Labs Dating  11-12 week [place] Blood type: O/POS/-- (10/27 2423)  Genetic Screen 1 Screen:Nml  AFP: Neg     NIPS: Antibody:NEG (10/27 5361) Anatomic [place] 19 wks, echogenic focus, otherwise nml Rubella: 6.95 (10/27 0907) GTT Early:    n/a         Third trimester: Nml RPR: NON REAC (10/27 4431)  Flu vaccine Declines HBsAg: NEGATIVE (10/27 0907)  TDaP vaccine Declines (Hando    Patient Active Problem List   Diagnosis Date Noted  . Supervision of other normal pregnancy, antepartum 09/18/2018  . Prior perinatal loss in second trimester, antepartum 01/07/2016  . History of 1 spontaneous abortion 10/29/2015    Past Surgical History:  Procedure Laterality Date  . DILATION AND CURETTAGE, DIAGNOSTIC / THERAPEUTIC      OB History    Gravida  4   Para  2   Term  2   Preterm      AB  1   Living  2     SAB  1   TAB      Ectopic      Multiple  0   Live Births  2            Home Medications    Prior to Admission medications     Medication Sig Start Date End Date Taking? Authorizing Provider  Blood Pressure Monitoring (BLOOD PRESSURE KIT) DEVI 1 Device by Does not apply route once a week. Patient not taking: Reported on 12/05/2018 09/19/18   Emily Filbert, MD  metroNIDAZOLE (FLAGYL) 500 MG tablet Take 1 tablet (500 mg total) by mouth 2 (two) times daily. Patient not taking: Reported on 12/05/2018 09/21/18   Emily Filbert, MD  metroNIDAZOLE (FLAGYL) 500 MG tablet Take 1 tablet (500 mg total) by mouth 2 (two) times daily. 12/16/18   Emily Filbert, MD  pantoprazole (PROTONIX) 20 MG tablet Take 1 tablet (20 mg total) by mouth daily. 12/05/18   Constant, Peggy, MD  Prenatal Vit-Fe Fumarate-FA (PRENATAL MULTIVITAMIN) TABS tablet Take 1 tablet by mouth daily at 12 noon.    [provider]  terconazole (TERAZOL 7) 0.4 % vaginal cream Place 1 applicator vaginally at bedtime. Patient not taking: Reported on 12/05/2018 09/25/18   Emily Filbert, MD    Family History Family History  Problem Relation Age of Onset  . Breast cancer Other     Social History Social History   Tobacco Use  .  Smoking status: Light Tobacco Smoker    Packs/day: 0.25    Types: Cigarettes  . Smokeless tobacco: Never Used  Substance Use Topics  . Alcohol use: No  . Drug use: No     Allergies   Patient has no known allergies.   Review of Systems Review of Systems  Constitutional: Negative for activity change, chills and fever.  HENT: Positive for congestion and voice change. Negative for sore throat.   Respiratory: Positive for cough, chest tightness, shortness of breath and wheezing.   Cardiovascular: Negative for chest pain and palpitations.  Gastrointestinal: Negative.   Genitourinary: Negative for dysuria, frequency and urgency.  Musculoskeletal: Negative.   Skin: Negative.   Neurological: Negative for dizziness, weakness and headaches.     Physical Exam Triage Vital Signs ED Triage Vitals  Enc Vitals Group     BP      Pulse       Resp      Temp      Temp src      SpO2      Weight      Height      Head Circumference      Peak Flow      Pain Score      Pain Loc      Pain Edu?      Excl. in [place]?    No data found.  Updated Vital Signs Ht _0  (1.626 m)   Wt 72.6 kg   LMP 07/02/2018   BMI 27.46 kg/m   Visual Acuity Right Eye Distance:   Left Eye Distance:   Bilateral Distance:    Right Eye Near:   Left Eye Near:    Bilateral Near:     Physical Exam Vitals and nursing note reviewed.  Constitutional:      General: She is not in acute distress.    Appearance: She is ill-appearing. She is not toxic-appearing.  Cardiovascular:     Rate and Rhythm: Normal rate and regular rhythm.     Pulses: Normal pulses.     Heart sounds: No murmur. No friction rub.  Pulmonary:     Breath sounds: Wheezing present. No rales.     Comments: Mild tachypnea.  Patient is able to complete sentences.  She is ambulating with mild shortness of breath. Chest:     Chest wall: No tenderness.  Abdominal:     General: Bowel sounds are normal. There is no distension.     Tenderness: There is no guarding or rebound.  Musculoskeletal:        General: No tenderness or signs of injury. Normal range of motion.     Cervical back: Normal range of motion. No tenderness.  Lymphadenopathy:     Cervical: No cervical adenopathy.  Skin:    General: Skin is warm.     Capillary Refill: Capillary refill takes less than 2 seconds.  Neurological:     General: No focal deficit present.     Mental Status: She is alert.      UC Treatments / Results  Labs (all labs ordered are listed, but only abnormal results are displayed) Labs Reviewed - No data to display  EKG   Radiology No results found.  Procedures Procedures (including critical care time)  Medications Ordered in UC Medications - No data to display  Initial Impression / Assessment and Plan / UC Course  I have reviewed the triage vital signs and the nursing  notes.  Pertinent labs & imaging  results that were available during my care of the patient were reviewed by me and considered in my medical decision making (see chart for details).     1.  Acute bronchitis with bronchospasm: Albuterol inhaler-1 puff every 6 hours inhaled as needed Prednisone 20 mg orally daily for 3 days COVID-19 testing done Patient is advised to self isolate until COVID-19 test results are available. If patient's condition worsens she may return to the urgent care to be reevaluated. Final Clinical Impressions(s) / UC Diagnoses   Final diagnoses:  None   Discharge Instructions   None    ED Prescriptions    None     PDMP not reviewed this encounter.   Chase Picket, MD 12/18/18 [phone]

## 2018-12-20 LAB — NOVEL CORONAVIRUS, NAA: SARS-CoV-2, NAA: NOT DETECTED

## 2019-01-06 ENCOUNTER — Encounter: Payer: Self-pay | Admitting: *Deleted

## 2019-01-06 ENCOUNTER — Encounter: Payer: Self-pay | Admitting: Obstetrics & Gynecology

## 2019-01-06 ENCOUNTER — Other Ambulatory Visit: Payer: Self-pay

## 2019-01-06 ENCOUNTER — Ambulatory Visit (INDEPENDENT_AMBULATORY_CARE_PROVIDER_SITE_OTHER): Payer: Medicaid Other | Admitting: Obstetrics & Gynecology

## 2019-01-06 VITALS — BP 108/67 | HR 87 | Temp 98.3°F | Wt 166.0 lb

## 2019-01-06 DIAGNOSIS — Z3A26 26 weeks gestation of pregnancy: Secondary | ICD-10-CM

## 2019-01-06 DIAGNOSIS — Z3480 Encounter for supervision of other normal pregnancy, unspecified trimester: Secondary | ICD-10-CM

## 2019-01-06 DIAGNOSIS — Z23 Encounter for immunization: Secondary | ICD-10-CM | POA: Diagnosis not present

## 2019-01-06 DIAGNOSIS — Z3400 Encounter for supervision of normal first pregnancy, unspecified trimester: Secondary | ICD-10-CM | POA: Diagnosis not present

## 2019-01-06 DIAGNOSIS — O09292 Supervision of pregnancy with other poor reproductive or obstetric history, second trimester: Secondary | ICD-10-CM

## 2019-01-06 NOTE — Progress Notes (Signed)
Pt never picked up BP cuff and is not doing BRX Opt sched    PRENATAL VISIT NOTE  Subjective:  Teresa Blanchard is a 29 y.o. [id number] at [redacted]w[redacted]d being seen today for ongoing prenatal care.  She is currently monitored for the following issues for this high-risk pregnancy and has History of 1 spontaneous abortion; Prior perinatal loss in second trimester, antepartum; and Supervision of other normal pregnancy, antepartum on their problem list.  Patient reports no complaints.  Contractions: Not present. Vag. Bleeding: None.  Movement: Present. Denies leaking of fluid.   The following portions of the patient's history were reviewed and updated as appropriate: allergies, current medications, past family history, past medical history, past social history, past surgical history and problem list.   Objective:   Vitals:   01/06/19 0900  BP: 108/67  Pulse: 87  Temp: 98.3 F (36.8 C)  Weight: 166 lb (75.3 kg)    Fetal Status: Fetal Heart Rate (bpm): 144   Movement: Present     General:  Alert, oriented and cooperative. Patient is in no acute distress.  Skin: Skin is warm and dry. No rash noted.   Cardiovascular: Normal heart rate noted  Respiratory: Normal respiratory effort, no problems with respiration noted  Abdomen: Soft, gravid, appropriate for gestational age.  Pain/Pressure: Absent     Pelvic: Cervical exam deferred        Extremities: Normal range of motion.  Edema: None  Mental Status: Normal mood and affect. Normal behavior. Normal judgment and thought content.   Assessment and Plan:  Pregnancy: [id number] at [redacted]w[redacted]d 1. Supervision of other normal pregnancy, antepartum - 2 Hour GTT - HIV antibody (with reflex) - CBC - RPR  Pt agrees to pick up BP cuff today.  She will be able to do virtual visits now.  (Will do next visit virtual at 29-30 weeks)  2. Prior perinatal loss in second trimester, antepartum Normal TVUS by MFM and released from follow up.  No complaints today  3.   Desires ppBTL Papers signed today  Preterm labor symptoms and general obstetric precautions including but not limited to vaginal bleeding, contractions, leaking of fluid and fetal movement were reviewed in detail with the patient. Please refer to After Visit Summary for other counseling recommendations.   Return in about 2 years (around 01/05/2021).  No future appointments.  Elsie Lincoln, MD

## 2019-01-07 ENCOUNTER — Encounter: Payer: Self-pay | Admitting: Obstetrics & Gynecology

## 2019-01-07 DIAGNOSIS — D696 Thrombocytopenia, unspecified: Secondary | ICD-10-CM | POA: Insufficient documentation

## 2019-01-07 DIAGNOSIS — O99119 Other diseases of the blood and blood-forming organs and certain disorders involving the immune mechanism complicating pregnancy, unspecified trimester: Secondary | ICD-10-CM | POA: Insufficient documentation

## 2019-01-07 LAB — RPR: RPR Ser Ql: NONREACTIVE

## 2019-01-07 LAB — CBC
HCT: 37.1 % (ref 35.0–45.0)
Hemoglobin: 12.7 g/dL (ref 11.7–15.5)
MCH: 29.5 pg (ref 27.0–33.0)
MCHC: 34.2 g/dL (ref 32.0–36.0)
MCV: 86.3 fL (ref 80.0–100.0)
MPV: 11.8 fL (ref 7.5–12.5)
Platelets: 135 10*3/uL — ABNORMAL LOW (ref 140–400)
RBC: 4.3 10*6/uL (ref 3.80–5.10)
RDW: 12.2 % (ref 11.0–15.0)
WBC: 11.1 10*3/uL — ABNORMAL HIGH (ref 3.8–10.8)

## 2019-01-07 LAB — 2HR GTT W 1 HR, CARPENTER, 75 G
Glucose, 1 Hr, Gest: 94 mg/dL (ref 65–179)
Glucose, 2 Hr, Gest: 103 mg/dL (ref 65–152)
Glucose, Fasting, Gest: 74 mg/dL (ref 65–91)

## 2019-01-07 LAB — HIV ANTIBODY (ROUTINE TESTING W REFLEX): HIV 1&2 Ab, 4th Generation: NONREACTIVE

## 2019-01-20 ENCOUNTER — Telehealth: Payer: Medicaid Other | Admitting: Obstetrics & Gynecology

## 2019-02-03 ENCOUNTER — Telehealth (INDEPENDENT_AMBULATORY_CARE_PROVIDER_SITE_OTHER): Payer: Medicaid Other | Admitting: Obstetrics & Gynecology

## 2019-02-03 DIAGNOSIS — D696 Thrombocytopenia, unspecified: Secondary | ICD-10-CM

## 2019-02-03 DIAGNOSIS — O99113 Other diseases of the blood and blood-forming organs and certain disorders involving the immune mechanism complicating pregnancy, third trimester: Secondary | ICD-10-CM

## 2019-02-03 DIAGNOSIS — Z3A30 30 weeks gestation of pregnancy: Secondary | ICD-10-CM

## 2019-02-03 DIAGNOSIS — Z3480 Encounter for supervision of other normal pregnancy, unspecified trimester: Secondary | ICD-10-CM

## 2019-02-03 NOTE — Progress Notes (Signed)
Patient ID: Teresa Blanchard, female   DOB: 31-Dec-1990, 29 y.o.   MRN: [id number]  I connected with Charlott on 02/03/19 at  1:15 PM EST by: mychart and verified that I am speaking with the correct person using two identifiers.  Patient is located at her car and provider is located at [place] office.     The purpose of this virtual visit is to provide medical care while limiting exposure to the novel coronavirus. I discussed the limitations, risks, security and privacy concerns of performing an evaluation and management service by mychart and the availability of in person appointments. I also discussed with the patient that there may be a patient responsible charge related to this service. By engaging in this virtual visit, you consent to the provision of healthcare.  Additionally, you authorize for your insurance to be billed for the services provided during this visit.  The patient expressed understanding and agreed to proceed.     PRENATAL VISIT NOTE  Subjective:  Teresa Blanchard is a 29 y.o. [id number] at [redacted]w[redacted]d  for phone visit for ongoing prenatal care.  She is currently monitored for the following issues for this low-risk pregnancy and has History of 1 spontaneous abortion; Prior perinatal loss in second trimester, antepartum; Supervision of other normal pregnancy, antepartum; and Thrombocytopenia affecting pregnancy (HCC) on their problem list.  Patient reports no complaints.  Contractions: Not present. Vag. Bleeding: None.  Movement: Present. Denies leaking of fluid.   The following portions of the patient's history were reviewed and updated as appropriate: allergies, current medications, past family history, past medical history, past social history, past surgical history and problem list.   Objective:   Vitals:   02/03/19 1107  Weight: 171 lb (77.6 kg)   Self-Obtained  Fetal Status:     Movement: Present     Assessment and Plan:  Pregnancy: [id number] at [redacted]w[redacted]d 1. Thrombocytopenia  affecting pregnancy (HCC) Recheck at 36 weeks  2. Supervision of other normal pregnancy, antepartum Pt took BP and crossed over into babyRx.  Pt agrees to take BP weekly.    Preterm labor symptoms and general obstetric precautions including but not limited to vaginal bleeding, contractions, leaking of fluid and fetal movement were reviewed in detail with the patient.  Return in about 2 weeks (around 02/17/2019) for Routine OB.  No future appointments.   Time spent on virtual visit: 12 minutes  Elsie Lincoln, MD

## 2019-02-05 ENCOUNTER — Encounter: Payer: Self-pay | Admitting: Obstetrics & Gynecology

## 2019-02-12 ENCOUNTER — Telehealth: Payer: Self-pay | Admitting: *Deleted

## 2019-02-12 ENCOUNTER — Other Ambulatory Visit: Payer: Self-pay

## 2019-02-12 ENCOUNTER — Encounter (HOSPITAL_COMMUNITY): Payer: Self-pay | Admitting: Obstetrics & Gynecology

## 2019-02-12 ENCOUNTER — Inpatient Hospital Stay (HOSPITAL_COMMUNITY)
Admission: AD | Admit: 2019-02-12 | Discharge: 2019-02-12 | Disposition: A | Discharge status: Home / Self Care | Payer: Medicaid Other | Attending: Obstetrics & Gynecology | Admitting: Obstetrics & Gynecology

## 2019-02-12 DIAGNOSIS — O99113 Other diseases of the blood and blood-forming organs and certain disorders involving the immune mechanism complicating pregnancy, third trimester: Secondary | ICD-10-CM | POA: Diagnosis not present

## 2019-02-12 DIAGNOSIS — D696 Thrombocytopenia, unspecified: Secondary | ICD-10-CM | POA: Insufficient documentation

## 2019-02-12 DIAGNOSIS — Z3A31 31 weeks gestation of pregnancy: Secondary | ICD-10-CM

## 2019-02-12 DIAGNOSIS — F17210 Nicotine dependence, cigarettes, uncomplicated: Secondary | ICD-10-CM | POA: Diagnosis not present

## 2019-02-12 DIAGNOSIS — Z3480 Encounter for supervision of other normal pregnancy, unspecified trimester: Secondary | ICD-10-CM

## 2019-02-12 DIAGNOSIS — O99333 Smoking (tobacco) complicating pregnancy, third trimester: Secondary | ICD-10-CM | POA: Insufficient documentation

## 2019-02-12 DIAGNOSIS — N939 Abnormal uterine and vaginal bleeding, unspecified: Secondary | ICD-10-CM | POA: Diagnosis present

## 2019-02-12 DIAGNOSIS — O26853 Spotting complicating pregnancy, third trimester: Secondary | ICD-10-CM | POA: Diagnosis not present

## 2019-02-12 LAB — URINALYSIS, ROUTINE W REFLEX MICROSCOPIC
Bilirubin Urine: NEGATIVE
Glucose, UA: NEGATIVE mg/dL
Hgb urine dipstick: NEGATIVE
Ketones, ur: NEGATIVE mg/dL
Leukocytes,Ua: NEGATIVE
Nitrite: NEGATIVE
Protein, ur: NEGATIVE mg/dL
Specific Gravity, Urine: 1.004 — ABNORMAL LOW (ref 1.005–1.030)
pH: 7 (ref 5.0–8.0)

## 2019-02-12 LAB — WET PREP, GENITAL
Sperm: NONE SEEN
Trich, Wet Prep: NONE SEEN
Yeast Wet Prep HPF POC: NONE SEEN

## 2019-02-12 NOTE — Telephone Encounter (Signed)
Returned call from 2:35 PM. Patient called and stated that she is having some spotting, [redacted]w[redacted]d. I don't have any clinical staff in office at this time so I called Mariel Aloe, RN at [facility]. Patient advised to go to MAU for evaluation due to no provider in office until 02/13/19 in the afternoon. Patient agreed.

## 2019-02-12 NOTE — MAU Note (Signed)
Pt reports pinkish-red spotting today around noon while wiping. Denies any discharge. Denies cramping or cntx. +FM

## 2019-02-12 NOTE — MAU Provider Note (Signed)
Chief Complaint:  Vaginal Bleeding   First Provider Initiated Contact with Patient 02/12/19 2036      HPI: Teresa Blanchard is a 29 y.o. [id number] at [redacted]w[redacted]d who presents to maternity admissions reporting spotting when wiping x 1 today. The bleeding was pink, light, and only seen when wiping.  There is no pain, there are no other symptoms. She denies recent intercourse or any changes in activity. She has hx 17 week delivery with second pregnancy, ? Abruption vs incompetent cervix followed by term delivery without complication.   She reports good fetal movement.  HPI  Past Medical History: Past Medical History:  Diagnosis Date  . GERD (gastroesophageal reflux disease)   . Medical history non-contributory   . Supervision of high risk pregnancy, antepartum 10/29/2015    Clinic  [facility] Prenatal Labs Dating  11-12 week [place] Blood type: O/POS/-- (10/27 0907)  Genetic Screen 1 Screen:Nml  AFP: Neg     NIPS: Antibody:NEG (10/27 0907) Anatomic [place] 19 wks, echogenic focus, otherwise nml Rubella: 6.95 (10/27 0907) GTT Early:    n/a         Third trimester: Nml RPR: NON REAC (10/27 1478)  Flu vaccine Declines HBsAg: NEGATIVE (10/27 0907)  TDaP vaccine Declines (Hando    Past obstetric history: OB History  Gravida Para Term Preterm AB Living  4 2 2   1 2   SAB TAB Ectopic Multiple Live Births  1     0 2    # Outcome Date GA Lbr Len/2nd Weight Sex Delivery Anes PTL Lv  4 Current           3 Term 05/14/16 [redacted]w[redacted]d 12:02 / 00:37 3055 g M Vag-Spont EPI  LIV  2 SAB 2014 [redacted]w[redacted]d      N FD  1 Term 2013   3487 g M Vag-Spont  N LIV    Past Surgical History: Past Surgical History:  Procedure Laterality Date  . DILATION AND CURETTAGE, DIAGNOSTIC / THERAPEUTIC      Family History: Family History  Problem Relation Age of Onset  . Breast cancer Other     Social History: Social History   Tobacco Use  . Smoking status: Light Tobacco Smoker    Packs/day: 0.25    Types: Cigarettes  . Smokeless  tobacco: Never Used  Substance Use Topics  . Alcohol use: No  . Drug use: No    Allergies: No Known Allergies  Meds:  No medications prior to admission.    ROS:  Review of Systems  Constitutional: Negative for chills, fatigue and fever.  Eyes: Negative for visual disturbance.  Respiratory: Negative for shortness of breath.   Cardiovascular: Negative for chest pain.  Gastrointestinal: Negative for abdominal pain, nausea and vomiting.  Genitourinary: Positive for vaginal bleeding. Negative for difficulty urinating, dysuria, flank pain, pelvic pain, vaginal discharge and vaginal pain.  Neurological: Negative for dizziness and headaches.  Psychiatric/Behavioral: Negative.      I have reviewed patient's Past Medical Hx, Surgical Hx, Family Hx, Social Hx, medications and allergies.   Physical Exam   Patient Vitals for the past 24 hrs:  BP Temp Temp src Pulse Resp SpO2 Height Weight  02/12/19 2052 122/76 -- -- 97 -- -- -- --  02/12/19 1809 126/64 98.4 F (36.9 C) Oral 100 16 98 % 5\' 4"  (1.626 m) 80.4 kg   Constitutional: Well-developed, well-nourished female in no acute distress.  Cardiovascular: normal rate Respiratory: normal effort GI: Abd soft, non-tender, gravid appropriate for gestational age.  MS: Extremities nontender, no edema, normal ROM Neurologic: Alert and oriented x 4.  GU: Neg CVAT.  PELVIC EXAM: Cervix pink, visually closed, without lesion, scant white creamy discharge, vaginal walls and external genitalia normal Bimanual exam: Cervix 0/long/high, firm, anterior, neg CMT, uterus nontender, nonenlarged, adnexa without tenderness, enlargement, or mass  Dilation: 1 Effacement (%): Thick Station: Ballotable Exam by:: Francella Solian, CNM  FHT:  Baseline 135, moderate variability, accelerations present, no decelerations Contractions: None on toco or to palpation   Labs: Results for orders placed or performed during the hospital encounter of 02/12/19 (from  the past 24 hour(s))  Wet prep, genital     Status: Abnormal   Collection Time: 02/12/19  8:50 PM   Specimen: Genital  Result Value Ref Range   Yeast Wet Prep HPF POC NONE SEEN NONE SEEN   Trich, Wet Prep NONE SEEN NONE SEEN   Clue Cells Wet Prep HPF POC PRESENT (A) NONE SEEN   WBC, Wet Prep HPF POC MODERATE (A) NONE SEEN   Sperm NONE SEEN    O/RH(D) POSITIVE/-- (09/17 1546)  Imaging:  No results found.  MAU Course/MDM: Orders Placed This Encounter  Procedures  . Wet prep, genital  . Urinalysis, Routine w reflex microscopic  . Discharge patient    No orders of the defined types were placed in this encounter.    NST reviewed and reactive No bleeding on today's exam.  Cervix 1/long/posterior, no signs of labor.  Bleeding likely light cervical bleeding.   D/C home with bleeding precautions Pt to return with any more bleeding, or with abdominal pain or decreased fetal movement [facility] changed to in office appt at pt request Return to MAU as needed for emergencies.     Assessment: 1. Spotting affecting pregnancy in third trimester   2. Thrombocytopenia affecting pregnancy ([place])   3. Supervision of other normal pregnancy, antepartum     Plan: Discharge home Labor precautions and fetal kick counts Follow-up [place] for [place] at [place] Follow up.   Specialty: Obstetrics and Gynecology Why: As scheduled, the office will call to reschedule you for an in office visit. Return to MAU as needed for emergencies.  Contact information: [place], [place] [phone]         Allergies as of 02/12/2019   No Known Allergies     Medication List    TAKE these medications   albuterol 108 (90 Base) MCG/ACT inhaler Commonly known as: VENTOLIN HFA Inhale 1-2 puffs into the lungs every 6 (six) hours as needed for wheezing or shortness of breath.   pantoprazole 20 MG tablet Commonly  known as: Protonix Take 1 tablet (20 mg total) by mouth daily.   prenatal multivitamin Tabs tablet Take 1 tablet by mouth daily at 12 noon.       Fatima Blank Certified Nurse-Midwife 02/12/2019 9:19 PM

## 2019-02-13 LAB — GC/CHLAMYDIA PROBE AMP (~~LOC~~) NOT AT ARMC
Chlamydia: NEGATIVE
Comment: NEGATIVE
Comment: NORMAL
Neisseria Gonorrhea: NEGATIVE

## 2019-02-18 ENCOUNTER — Telehealth (INDEPENDENT_AMBULATORY_CARE_PROVIDER_SITE_OTHER): Payer: Medicaid Other | Admitting: Obstetrics and Gynecology

## 2019-02-18 DIAGNOSIS — D696 Thrombocytopenia, unspecified: Secondary | ICD-10-CM

## 2019-02-18 DIAGNOSIS — O99113 Other diseases of the blood and blood-forming organs and certain disorders involving the immune mechanism complicating pregnancy, third trimester: Secondary | ICD-10-CM

## 2019-02-18 DIAGNOSIS — Z3480 Encounter for supervision of other normal pregnancy, unspecified trimester: Secondary | ICD-10-CM

## 2019-02-18 DIAGNOSIS — Z3A32 32 weeks gestation of pregnancy: Secondary | ICD-10-CM

## 2019-02-18 DIAGNOSIS — O99119 Other diseases of the blood and blood-forming organs and certain disorders involving the immune mechanism complicating pregnancy, unspecified trimester: Secondary | ICD-10-CM

## 2019-02-18 NOTE — Progress Notes (Signed)
   TELEHEALTH OBSTETRICS PRENATAL VIRTUAL VIDEO VISIT ENCOUNTER NOTE  Provider location: [facility] [place]   I connected with Teresa Blanchard on 02/18/19 at  2:45 PM EST by WebEx Encounter at home and verified that I am speaking with the correct person using two identifiers.   I discussed the limitations, risks, security and privacy concerns of performing an evaluation and management service virtually and the availability of in person appointments. I also discussed with the patient that there may be a patient responsible charge related to this service. The patient expressed understanding and agreed to proceed. Subjective:  Teresa Blanchard is a 29 y.o. [phone] at [redacted]w[redacted]d being seen today for ongoing prenatal care.  She is currently monitored for the following issues for this low-risk pregnancy and has History of 1 spontaneous abortion; Prior perinatal loss in second trimester, antepartum; Supervision of other normal pregnancy, antepartum; and Thrombocytopenia affecting pregnancy (HCC) on their problem list.  Patient reports swelling in right hand.  Contractions: Not present. Vag. Bleeding: None.  Movement: Present. Denies any leaking of fluid.   Right hand swelling that started a couple weeks ago. Says at night the swelling is worse. 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 When she hangs her arm down below her bed the pain and swelling improves in her hand/arm. No SOB, no pain in chest or legs.   The following portions of the patient's history were reviewed and updated as appropriate: allergies, current medications, past family history, past medical history, past social history, past surgical history and problem list.   Objective:   Vitals:   02/18/19 1441  BP: 125/78  Pulse: 84  Weight: 173 lb (78.5 kg)    Fetal Status:     Movement: Present     General:  Alert, oriented and cooperative. Patient is in no acute distress.  Respiratory: Normal  respiratory effort, no problems with respiration noted  Mental Status: Normal mood and affect. Normal behavior. Normal judgment and thought content.  Rest of physical exam deferred due to type of encounter  Imaging: No results found.  Assessment and Plan:  Pregnancy: [id number] at [redacted]w[redacted]d 1. Thrombocytopenia affecting pregnancy (HCC)  Recheck at 36 weeks.    2. Supervision of other normal pregnancy, antepartum  + fetal movement ? Carpel tunnel d/t increase swelling and discomfort in right hand. Recommend a brace for driving. When home try to alternate between elevation and mobilization. If symptoms worsen she should go be evaluated for DVT.   Preterm labor symptoms and general obstetric precautions including but not limited to vaginal bleeding, contractions, leaking of fluid and fetal movement were reviewed in detail with the patient. I discussed the assessment and treatment plan with the patient. The patient was provided an opportunity to ask questions and all were answered. The patient agreed with the plan and demonstrated an understanding of the instructions. The patient was advised to call back or seek an in-person office evaluation/go to MAU at [facility] for any urgent or concerning symptoms. Please refer to After Visit Summary for other counseling recommendations.   I provided 11 minutes of face-to-face time during this encounter.  Return in about 2 weeks (around 03/04/2019) for in person visit per patient request. .  No future appointments.  Venia Carbon, NP [facility], [facility]

## 2019-03-06 ENCOUNTER — Other Ambulatory Visit: Payer: Self-pay

## 2019-03-06 ENCOUNTER — Ambulatory Visit (INDEPENDENT_AMBULATORY_CARE_PROVIDER_SITE_OTHER): Payer: Medicaid Other | Admitting: Obstetrics & Gynecology

## 2019-03-06 VITALS — BP 126/86 | HR 79 | Wt 180.0 lb

## 2019-03-06 DIAGNOSIS — Z3483 Encounter for supervision of other normal pregnancy, third trimester: Secondary | ICD-10-CM

## 2019-03-06 DIAGNOSIS — Z3480 Encounter for supervision of other normal pregnancy, unspecified trimester: Secondary | ICD-10-CM

## 2019-03-06 DIAGNOSIS — D696 Thrombocytopenia, unspecified: Secondary | ICD-10-CM

## 2019-03-06 DIAGNOSIS — O99119 Other diseases of the blood and blood-forming organs and certain disorders involving the immune mechanism complicating pregnancy, unspecified trimester: Secondary | ICD-10-CM

## 2019-03-06 DIAGNOSIS — R609 Edema, unspecified: Secondary | ICD-10-CM | POA: Diagnosis not present

## 2019-03-06 DIAGNOSIS — Z3A34 34 weeks gestation of pregnancy: Secondary | ICD-10-CM

## 2019-03-06 NOTE — Progress Notes (Signed)
   PRENATAL VISIT NOTE  Subjective:  Teresa Blanchard isa 29 y.o. [phone] at [redacted]w[redacted]d being seen today for ongoing prenatal care.  She is currently monitored for the following issues for this low-risk pregnancy and has History of 1 spontaneous abortion; Prior perinatal loss in second trimester, antepartum; Supervision of other normal pregnancy, antepartum; and Thrombocytopenia affecting pregnancy (HCC) on their problem list.  Patient reports pedal edema, works walking a lot, also with headache recently.  Contractions: Not present. Vag. Bleeding: None.  Movement: Present. Denies leaking of fluid.   The following portions of the patient's history were reviewed and updated as appropriate: allergies, current medications, past family history, past medical history, past social history, past surgical history and problem list.   Objective:   Vitals:   03/06/19 1440  BP: 126/86  Pulse: 79  Weight: 180 lb (81.6 kg)    Fetal Status: Fetal Heart Rate (bpm): 152   Movement: Present     General:  Alert, oriented and cooperative. Patient is in no acute distress.  Skin: Skin is warm and dry. No rash noted.   Cardiovascular: Normal heart rate noted  Respiratory: Normal respiratory effort, no problems with respiration noted  Abdomen: Soft, gravid, appropriate for gestational age.  Pain/Pressure: Present     Pelvic: Cervical exam deferred        Extremities: Normal range of motion.  Edema: Trace  Mental Status: Normal mood and affect. Normal behavior. Normal judgment and thought content.   Assessment and Plan:  Pregnancy: [id number] at [redacted]w[redacted]d 1. Thrombocytopenia affecting pregnancy (HCC)  - CBC  2. Edema, unspecified type - pre eclampsia precautions reviewed - Comprehensive metabolic panel  Preterm labor symptoms and general obstetric precautions including but not limited to vaginal bleeding, contractions, leaking of fluid and fetal movement were reviewed in detail with the patient. Please refer to  After Visit Summary for other counseling recommendations.   Return in about 1 week (around 03/13/2019) for in person, cervical cultures.  No future appointments.  Allie Bossier, MD

## 2019-03-07 LAB — COMPREHENSIVE METABOLIC PANEL
AG Ratio: 1.3 (calc) (ref 1.0–2.5)
ALT: 15 U/L (ref 6–29)
AST: 19 U/L (ref 10–30)
Albumin: 3.2 g/dL — ABNORMAL LOW (ref 3.6–5.1)
Alkaline phosphatase (APISO): 300 U/L — ABNORMAL HIGH (ref 31–125)
BUN/Creatinine Ratio: 10 (calc) (ref 6–22)
BUN: 5 mg/dL — ABNORMAL LOW (ref 7–25)
CO2: 24 mmol/L (ref 20–32)
Calcium: 8.4 mg/dL — ABNORMAL LOW (ref 8.6–10.2)
Chloride: 105 mmol/L (ref 98–110)
Creat: 0.48 mg/dL — ABNORMAL LOW (ref 0.50–1.10)
Globulin: 2.5 g/dL (calc) (ref 1.9–3.7)
Glucose, Bld: 69 mg/dL (ref 65–99)
Potassium: 4.3 mmol/L (ref 3.5–5.3)
Sodium: 135 mmol/L (ref 135–146)
Total Bilirubin: 0.3 mg/dL (ref 0.2–1.2)
Total Protein: 5.7 g/dL — ABNORMAL LOW (ref 6.1–8.1)

## 2019-03-07 LAB — CBC
HCT: 33.3 % — ABNORMAL LOW (ref 35.0–45.0)
Hemoglobin: 11 g/dL — ABNORMAL LOW (ref 11.7–15.5)
MCH: 26.6 pg — ABNORMAL LOW (ref 27.0–33.0)
MCHC: 33 g/dL (ref 32.0–36.0)
MCV: 80.6 fL (ref 80.0–100.0)
MPV: 12.3 fL (ref 7.5–12.5)
Platelets: 194 10*3/uL (ref 140–400)
RBC: 4.13 10*6/uL (ref 3.80–5.10)
RDW: 12.8 % (ref 11.0–15.0)
WBC: 17.1 10*3/uL — ABNORMAL HIGH (ref 3.8–10.8)

## 2019-03-09 ENCOUNTER — Encounter (HOSPITAL_COMMUNITY): Payer: Self-pay | Admitting: Obstetrics & Gynecology

## 2019-03-09 ENCOUNTER — Other Ambulatory Visit: Payer: Self-pay

## 2019-03-09 ENCOUNTER — Inpatient Hospital Stay (HOSPITAL_COMMUNITY)
Admission: AD | Admit: 2019-03-09 | Discharge: 2019-03-10 | Disposition: A | Discharge status: Home / Self Care | Payer: Medicaid Other | Attending: Obstetrics & Gynecology | Admitting: Obstetrics & Gynecology

## 2019-03-09 DIAGNOSIS — Z3480 Encounter for supervision of other normal pregnancy, unspecified trimester: Secondary | ICD-10-CM

## 2019-03-09 DIAGNOSIS — F17210 Nicotine dependence, cigarettes, uncomplicated: Secondary | ICD-10-CM | POA: Insufficient documentation

## 2019-03-09 DIAGNOSIS — Z3A35 35 weeks gestation of pregnancy: Secondary | ICD-10-CM | POA: Insufficient documentation

## 2019-03-09 DIAGNOSIS — D696 Thrombocytopenia, unspecified: Secondary | ICD-10-CM

## 2019-03-09 DIAGNOSIS — O26893 Other specified pregnancy related conditions, third trimester: Secondary | ICD-10-CM | POA: Insufficient documentation

## 2019-03-09 DIAGNOSIS — Z3689 Encounter for other specified antenatal screening: Secondary | ICD-10-CM

## 2019-03-09 DIAGNOSIS — O99333 Smoking (tobacco) complicating pregnancy, third trimester: Secondary | ICD-10-CM | POA: Insufficient documentation

## 2019-03-09 DIAGNOSIS — R030 Elevated blood-pressure reading, without diagnosis of hypertension: Secondary | ICD-10-CM | POA: Insufficient documentation

## 2019-03-09 NOTE — MAU Note (Addendum)
Checked blood pressure at 9 pm 143/86 then checked again, 137/91. Was called by on-call nurse and they told her to be in MAU within an hour, to be checked. Headaches on and off for 2 weeks, denies headache right now, was tested at office for pre-eclampsia last Thursday. No issues with vision. No abd pain.  Baby moving well. No bleeding. No leaking.

## 2019-03-10 DIAGNOSIS — D696 Thrombocytopenia, unspecified: Secondary | ICD-10-CM | POA: Diagnosis not present

## 2019-03-10 DIAGNOSIS — O99113 Other diseases of the blood and blood-forming organs and certain disorders involving the immune mechanism complicating pregnancy, third trimester: Secondary | ICD-10-CM

## 2019-03-10 DIAGNOSIS — R030 Elevated blood-pressure reading, without diagnosis of hypertension: Secondary | ICD-10-CM | POA: Diagnosis present

## 2019-03-10 DIAGNOSIS — O99333 Smoking (tobacco) complicating pregnancy, third trimester: Secondary | ICD-10-CM | POA: Diagnosis not present

## 2019-03-10 DIAGNOSIS — Z3A35 35 weeks gestation of pregnancy: Secondary | ICD-10-CM

## 2019-03-10 DIAGNOSIS — F17210 Nicotine dependence, cigarettes, uncomplicated: Secondary | ICD-10-CM | POA: Diagnosis not present

## 2019-03-10 DIAGNOSIS — O26893 Other specified pregnancy related conditions, third trimester: Secondary | ICD-10-CM | POA: Diagnosis not present

## 2019-03-10 LAB — PROTEIN / CREATININE RATIO, URINE
Creatinine, Urine: 11.85 mg/dL
Total Protein, Urine: <6 mg/dL

## 2019-03-10 LAB — COMPREHENSIVE METABOLIC PANEL
ALT: 21 U/L (ref 0–44)
AST: 22 U/L (ref 15–41)
Albumin: 2.4 g/dL — ABNORMAL LOW (ref 3.5–5.0)
Alkaline Phosphatase: 278 U/L — ABNORMAL HIGH (ref 38–126)
Anion gap: 10 (ref 5–15)
BUN: 5 mg/dL — ABNORMAL LOW (ref 6–20)
CO2: 21 mmol/L — ABNORMAL LOW (ref 22–32)
Calcium: 8.4 mg/dL — ABNORMAL LOW (ref 8.9–10.3)
Chloride: 105 mmol/L (ref 98–111)
Creatinine, Ser: 0.53 mg/dL (ref 0.44–1.00)
GFR calc Af Amer: >60 mL/min (ref >60)
GFR calc non Af Amer: >60 mL/min (ref >60)
Glucose, Bld: 91 mg/dL (ref 70–99)
Potassium: 3.8 mmol/L (ref 3.5–5.1)
Sodium: 136 mmol/L (ref 135–145)
Total Bilirubin: 0.2 mg/dL — ABNORMAL LOW (ref 0.3–1.2)
Total Protein: 5.5 g/dL — ABNORMAL LOW (ref 6.5–8.1)

## 2019-03-10 LAB — CBC
HCT: 33.3 % — ABNORMAL LOW (ref 36.0–46.0)
Hemoglobin: 10.8 g/dL — ABNORMAL LOW (ref 12.0–15.0)
MCH: 26.5 pg (ref 26.0–34.0)
MCHC: 32.4 g/dL (ref 30.0–36.0)
MCV: 81.6 fL (ref 80.0–100.0)
Platelets: 198 10*3/uL (ref 150–400)
RBC: 4.08 MIL/uL (ref 3.87–5.11)
RDW: 13.4 % (ref 11.5–15.5)
WBC: 17.4 10*3/uL — ABNORMAL HIGH (ref 4.0–10.5)
nRBC: 0 % (ref 0.0–0.2)

## 2019-03-10 NOTE — Discharge Instructions (Signed)

## 2019-03-10 NOTE — MAU Provider Note (Signed)
History     CSN: [id number]  Arrival date and time: 03/09/19 2334   First Provider Initiated Contact with Patient 03/10/19 0114      Chief Complaint  Patient presents with  . Hypertension   HPI Teresa Blanchard is a 29 y.o. [id number] at 107w3d who presents to MAU for evaluation after elevated blood pressures on her home cuff. She states she obtained readings of 143/86 and 137/91 then received a nurse phone call and was told to present to MAU. Upon arrival to MAU she denies headache, visual disturbances, RUQ/epigastric pain, new onset swelling or weight gain. She states she has experienced episodic headaches during the previous two weeks but they consistently resolve with Tylenol.  Patient denies vaginal bleeding, leaking of fluid, decreased fetal movement, fever, falls, or recent illness.   She receives prenatal care with [facility] and her next appointment is 03/13/2019.  OB History    Gravida  4   Para  2   Term  2   Preterm      AB  1   Living  2     SAB  1   TAB      Ectopic      Multiple  0   Live Births  2           Past Medical History:  Diagnosis Date  . GERD (gastroesophageal reflux disease)   . Medical history non-contributory   . Supervision of high risk pregnancy, antepartum 10/29/2015    Clinic  [facility] Prenatal Labs Dating  11-12 week [place] Blood type: O/POS/-- (10/27 7026)  Genetic Screen 1 Screen:Nml  AFP: Neg     NIPS: Antibody:NEG (10/27 3785) Anatomic [place] 19 wks, echogenic focus, otherwise nml Rubella: 6.95 (10/27 0907) GTT Early:    n/a         Third trimester: Nml RPR: NON REAC (10/27 8850)  Flu vaccine Declines HBsAg: NEGATIVE (10/27 0907)  TDaP vaccine Declines ([place]o    Past Surgical History:  Procedure Laterality Date  . DILATION AND CURETTAGE, DIAGNOSTIC / THERAPEUTIC      Family History  Problem Relation Age of Onset  . Breast cancer Other     Social History   Tobacco Use  . Smoking status: Light Tobacco Smoker     Packs/day: 0.25    Types: Cigarettes  . Smokeless tobacco: Never Used  Substance Use Topics  . Alcohol use: No  . Drug use: No    Allergies: No Known Allergies  Medications Prior to Admission  Medication Sig Dispense Refill Last Dose  . albuterol (VENTOLIN HFA) 108 (90 Base) MCG/ACT inhaler Inhale 1-2 puffs into the lungs every 6 (six) hours as needed for wheezing or shortness of breath. 18 g 0 Past Month at Unknown time  . pantoprazole (PROTONIX) 20 MG tablet Take 1 tablet (20 mg total) by mouth daily. 30 tablet 3 03/08/2019 at Unknown time  . Prenatal Vit-Fe Fumarate-FA (PRENATAL MULTIVITAMIN) TABS tablet Take 1 tablet by mouth daily at 12 noon.   More than a month at Unknown time    Review of Systems  Constitutional: Negative for chills, fatigue and fever.  Eyes: Negative for photophobia and visual disturbance.  Respiratory: Negative for shortness of breath.   Gastrointestinal: Negative for abdominal pain.  Genitourinary: Negative for vaginal bleeding.  Musculoskeletal: Negative for back pain.  Neurological: Negative for syncope, weakness and headaches.  All other systems reviewed and are negative.  Physical Exam   Blood pressure 111/62, pulse 68, temperature  98.5 F (36.9 C), temperature source Oral, resp. rate 16, height 5\' 4"  (1.626 m), weight 83 kg, last menstrual period 07/02/2018, SpO2 97 %, unknown if currently breastfeeding.  Physical Exam  Nursing note and vitals reviewed. Constitutional: She is oriented to person, place, and time. She appears well-developed and well-nourished.  Cardiovascular: Normal rate and normal heart sounds.  Respiratory: Effort normal and breath sounds normal.  GI: Soft. She exhibits no distension. There is no abdominal tenderness. There is no rebound and no guarding.  Gravid  Musculoskeletal:        General: Normal range of motion.  Neurological: She is alert and oriented to person, place, and time.  Skin: Skin is warm and dry.   Psychiatric: She has a normal mood and affect. Her behavior is normal. Thought content normal.    MAU Course  Procedures  --Reactive tracing: baseline 130, mod variability, positive accels, no decels --Toco: occasional uterine irritability  Patient Vitals for the past 24 hrs:  BP Temp Temp src Pulse Resp SpO2 Height Weight  03/10/19 0115 -- -- -- -- -- 97 % -- --  03/10/19 0111 (!) 91/55 -- -- 63 18 -- -- --  03/10/19 0110 -- -- -- -- -- 97 % -- --  03/10/19 0105 -- -- -- -- -- 97 % -- --  03/10/19 0100 111/62 -- -- 68 -- 97 % -- --  03/10/19 0055 -- -- -- -- -- 97 % -- --  03/10/19 0031 115/72 -- -- 67 -- -- -- --  03/10/19 0030 -- -- -- -- -- 98 % -- --  03/10/19 0015 -- -- -- -- -- 98 % -- --  03/10/19 0014 121/80 -- -- 79 -- -- -- --  03/09/19 2350 131/79 98.5 F (36.9 C) Oral 83 16 -- 5\' 4"  (1.626 m) 83 kg   Results for orders placed or performed during the hospital encounter of 03/09/19 (from the past 24 hour(s))  Protein / creatinine ratio, urine     Status: None   Collection Time: 03/10/19 12:03 AM  Result Value Ref Range   Creatinine, Urine 11.85 mg/dL   Total Protein, Urine <6 mg/dL   Protein Creatinine Ratio        0.00 - 0.15 mg/mg[Cre]  CBC     Status: Abnormal   Collection Time: 03/10/19 12:05 AM  Result Value Ref Range   WBC 17.4 (H) 4.0 - 10.5 K/uL   RBC 4.08 3.87 - 5.11 MIL/uL   Hemoglobin 10.8 (L) 12.0 - 15.0 g/dL   HCT 33.3 (L) 36.0 - 46.0 %   MCV 81.6 80.0 - 100.0 fL   MCH 26.5 26.0 - 34.0 pg   MCHC 32.4 30.0 - 36.0 g/dL   RDW 13.4 11.5 - 15.5 %   Platelets 198 150 - 400 K/uL   nRBC 0.0 0.0 - 0.2 %  Comprehensive metabolic panel     Status: Abnormal   Collection Time: 03/10/19 12:05 AM  Result Value Ref Range   Sodium 136 135 - 145 mmol/L   Potassium 3.8 3.5 - 5.1 mmol/L   Chloride 105 98 - 111 mmol/L   CO2 21 (L) 22 - 32 mmol/L   Glucose, Bld 91 70 - 99 mg/dL   BUN 5 (L) 6 - 20 mg/dL   Creatinine, Ser 0.53 0.44 - 1.00 mg/dL   Calcium 8.4  (L) 8.9 - 10.3 mg/dL   Total Protein 5.5 (L) 6.5 - 8.1 g/dL   Albumin 2.4 (L) 3.5 - 5.0  g/dL   AST 22 15 - 41 U/L   ALT 21 0 - 44 U/L   Alkaline Phosphatase 278 (H) 38 - 126 U/L   Total Bilirubin 0.2 (L) 0.3 - 1.2 mg/dL   GFR calc non Af Amer >60 >60 mL/min   GFR calc Af Amer >60 >60 mL/min   Anion gap 10 5 - 15   Assessment and Plan  --29 y.o. [id number] at [redacted]w[redacted]d  --Reactive tracing --Normotensive during MAU visit --Normal PEC labs --Discharge home in stable condition with PEC precautions  F/U: --[facility] 03/13/2019  Calvert Cantor, CNM 03/10/2019, 1:39 AM

## 2019-03-13 ENCOUNTER — Other Ambulatory Visit: Payer: Self-pay

## 2019-03-13 ENCOUNTER — Other Ambulatory Visit (HOSPITAL_COMMUNITY)
Admission: RE | Admit: 2019-03-13 | Discharge: 2019-03-13 | Disposition: A | Discharge status: Home / Self Care | Payer: Medicaid Other | Source: Ambulatory Visit | Attending: Certified Nurse Midwife | Admitting: Certified Nurse Midwife

## 2019-03-13 ENCOUNTER — Ambulatory Visit (INDEPENDENT_AMBULATORY_CARE_PROVIDER_SITE_OTHER): Payer: Medicaid Other | Admitting: Certified Nurse Midwife

## 2019-03-13 VITALS — BP 132/77 | HR 92 | Wt 181.0 lb

## 2019-03-13 DIAGNOSIS — D696 Thrombocytopenia, unspecified: Secondary | ICD-10-CM

## 2019-03-13 DIAGNOSIS — O99119 Other diseases of the blood and blood-forming organs and certain disorders involving the immune mechanism complicating pregnancy, unspecified trimester: Secondary | ICD-10-CM

## 2019-03-13 DIAGNOSIS — Z3480 Encounter for supervision of other normal pregnancy, unspecified trimester: Secondary | ICD-10-CM

## 2019-03-13 DIAGNOSIS — Z3A35 35 weeks gestation of pregnancy: Secondary | ICD-10-CM

## 2019-03-13 NOTE — Progress Notes (Signed)
Subjective:  Teresa Blanchard is a 29 y.o. [phone] at [redacted]w[redacted]d being seen today for ongoing prenatal care.  She is currently monitored for the following issues for this low-risk pregnancy and has History of 1 spontaneous abortion; Prior perinatal loss in second trimester, antepartum; Supervision of other normal pregnancy, antepartum; and Thrombocytopenia affecting pregnancy (HCC) on their problem list.  Patient reports no complaints.  Contractions: Irritability. Vag. Bleeding: None.  Movement: Present. Denies leaking of fluid.   The following portions of the patient's history were reviewed and updated as appropriate: allergies, current medications, past family history, past medical history, past social history, past surgical history and problem list. Problem list updated.  Objective:   Vitals:   03/13/19 1401  BP: 132/77  Pulse: 92  Weight: 181 lb (82.1 kg)    Fetal Status: Fetal Heart Rate (bpm): 148 Fundal Height: 36 cm Movement: Present  Presentation: Vertex  General:  Alert, oriented and cooperative. Patient is in no acute distress.  Skin: Skin is warm and dry. No rash noted.   Cardiovascular: Normal heart rate noted  Respiratory: Normal respiratory effort, no problems with respiration noted  Abdomen: Soft, gravid, appropriate for gestational age. Pain/Pressure: Present     Pelvic: Vag. Bleeding: None Vag D/C Character: Thin   Cervical exam performed Dilation: 1.5 Effacement (%): 70 Station: -2  Extremities: Normal range of motion.  Edema: Trace  Mental Status: Normal mood and affect. Normal behavior. Normal judgment and thought content.   Urinalysis:      Assessment and Plan:  Pregnancy: [id number] at [redacted]w[redacted]d  1. Supervision of other normal pregnancy, antepartum - Culture, beta strep (group b only) - Cervicovaginal ancillary only( [place]) - pt requested to be written out of work d/t walking and lifting; informed there's no medical indication to stop work since pregnancy is  uncomplicated, offered to write note for weight restriction (over 20 lbs) but states she has a note already.  2. Thrombocytopenia affecting pregnancy (HCC) - Plt stable, now 198  Preterm labor symptoms and general obstetric precautions including but not limited to vaginal bleeding, contractions, leaking of fluid and fetal movement were reviewed in detail with the patient. Please refer to After Visit Summary for other counseling recommendations.  Return in about 2 weeks (around 03/27/2019).   Donette Larry, CNM

## 2019-03-14 LAB — CERVICOVAGINAL ANCILLARY ONLY
Chlamydia: NEGATIVE
Comment: NEGATIVE
Comment: NORMAL
Neisseria Gonorrhea: NEGATIVE

## 2019-03-16 LAB — CULTURE, BETA STREP (GROUP B ONLY)
MICRO NUMBER:: 10240984
SPECIMEN QUALITY:: ADEQUATE

## 2019-03-27 ENCOUNTER — Other Ambulatory Visit: Payer: Self-pay

## 2019-03-27 ENCOUNTER — Ambulatory Visit (INDEPENDENT_AMBULATORY_CARE_PROVIDER_SITE_OTHER): Payer: Medicaid Other | Admitting: Obstetrics & Gynecology

## 2019-03-27 VITALS — BP 136/89 | HR 81 | Wt 184.0 lb

## 2019-03-27 DIAGNOSIS — Z3480 Encounter for supervision of other normal pregnancy, unspecified trimester: Secondary | ICD-10-CM

## 2019-03-27 DIAGNOSIS — O09292 Supervision of pregnancy with other poor reproductive or obstetric history, second trimester: Secondary | ICD-10-CM

## 2019-03-27 DIAGNOSIS — Z3A37 37 weeks gestation of pregnancy: Secondary | ICD-10-CM

## 2019-03-27 DIAGNOSIS — O99119 Other diseases of the blood and blood-forming organs and certain disorders involving the immune mechanism complicating pregnancy, unspecified trimester: Secondary | ICD-10-CM

## 2019-03-27 DIAGNOSIS — D696 Thrombocytopenia, unspecified: Secondary | ICD-10-CM

## 2019-03-27 NOTE — Patient Instructions (Signed)
Natural Childbirth Natural childbirth is when labor and delivery progresses naturally with minimal medical assistance or medicines. Natural childbirth may be an option if you have a low risk pregnancy. With the help of a birthing professional such as a midwife, doula, or other health care provider, you may be able to use relaxation techniques and controlled breathing to manage pain during labor. Many women choose natural childbirth because it makes them feel more in control and in touch with the experience of giving birth. Some women also choose natural childbirth because they are concerned that medicines may affect them and their babies. What types of natural childbirth techniques are available? There are two types of natural childbirth techniques, which are taught in classes:  The Lamaze method. In these classes, parents learn that having a baby is normal, healthy, and natural. Mothers are taught to take a neutral position regarding pain medicine and numbing medicines, and to make an informed decision about using these medicines if the time comes.  The Bradley method, also called husband-coached birth. In these classes, the father or partner learns to be the birth coach. The mother is encouraged to exercise and eat a balanced, nutritious diet. Both parents also learn relaxation and deep breathing exercises and are taught how to prepare for emergency situations. What are some natural pain and relaxation techniques? If you are considering a natural childbirth, you should explore your options for managing pain and discomfort during your labor and delivery. Some natural pain and relaxation techniques include:  Meditation.  Yoga.  Hypnosis.  Acupuncture.  Massage.  Changing positions, such as by walking, rocking, showering, or leaning on birth balls.  Lying in warm water or a whirlpool bath.  Finding an activity that keeps your mind off the labor pain.  Listening to soft music.  Focusing  on a particular object (visual imagery). How can I prepare for a natural birth?  Talk with your spouse or partner about your goals for having a natural childbirth.  Decide if you will have your baby in the hospital, at a birthing center, or at home.  Have your spouse or partner attend the natural childbirth technique classes with you.  Decide which type of health care provider you would like to assist you with your delivery.  If you have other children, make plans to have someone take care of them when you go to the hospital or birthing center.  Know the distance and the time it takes to go to the hospital or birthing center. Practice going there and time it to be sure.  Have a bag packed with a nightgown, bathrobe, and toiletries. Be ready to take it with you when you go into labor.  Keep phone numbers of your family and friends handy if you need to call someone when you go into labor.  Talk with your health care provider about the possibility of a medical emergency and what will happen if that occurs. What are the advantages and disadvantages of natural childbirth? Advantages  You are in control of your labor and delivery experience.  You may be able to avoid some common medical practices, such as getting medicines or being monitored all the time.  You and your spouse or partner will work together, which can increase your bond with each other.  In most delivery centers, your family and friends can be involved in the labor and delivery process. Disadvantages  The methods of helping relieve labor pains may not work for you.  You may feel   disappointed if you change your mind during labor and decide not to have a natural childbirth. What can I expect after delivery?  You may feel very tired.  You may feel uncomfortable as your uterus contracts.  The area around your vagina will feel sore.  You may feel cold and shaky. What questions should I ask my health care  provider?  Am I a good candidate for natural childbirth?  Can you refer me to classes to learn more about natural childbirth?  How do I create a birth plan that outlines my wishes for natural childbirth? Where to find more information  American Pregnancy Association: americanpregnancy.org  American Congress of Obstetricians and Gynecologists: acog.org  American College of Nurse-Midwives: www.midwife.org Summary  Natural childbirth may be an option if you have a low risk pregnancy.  The Bradley or Lamaze Methods are techniques that can assist you in achieving a natural birth experience.  Talk to your health care provider to determine if you are a good candidate for a natural childbirth. This information is not intended to replace advice given to you by your health care provider. Make sure you discuss any questions you have with your health care provider. Document Revised: 04/12/2018 Document Reviewed: 02/28/2016 Elsevier Patient Education  2020 Elsevier Inc.  

## 2019-03-27 NOTE — Progress Notes (Signed)
   PRENATAL VISIT NOTE  Subjective:  Teresa Blanchard is a 29 y.o. [phone] at [redacted]w[redacted]d being seen today for ongoing prenatal care.  She is currently monitored for the following issues for this low-risk pregnancy and has History of 1 spontaneous abortion; Prior perinatal loss in second trimester, antepartum; Supervision of other normal pregnancy, antepartum; and Thrombocytopenia affecting pregnancy (HCC) on their problem list.  Patient reports heartburn.  Contractions: Irregular. Vag. Bleeding: None.  Movement: Present. Denies leaking of fluid.   The following portions of the patient's history were reviewed and updated as appropriate: allergies, current medications, past family history, past medical history, past social history, past surgical history and problem list.   Objective:   Vitals:   03/27/19 1503  BP: 136/89  Pulse: 81  Weight: 184 lb (83.5 kg)    Fetal Status: Fetal Heart Rate (bpm): 136   Movement: Present     General:  Alert, oriented and cooperative. Patient is in no acute distress.  Skin: Skin is warm and dry. No rash noted.   Cardiovascular: Normal heart rate noted  Respiratory: Normal respiratory effort, no problems with respiration noted  Abdomen: Soft, gravid, appropriate for gestational age.  Pain/Pressure: Present     Pelvic: Cervical exam performed in the presence of a chaperone        Extremities: Normal range of motion.  Edema: Trace  Mental Status: Normal mood and affect. Normal behavior. Normal judgment and thought content.   Assessment and Plan:  Pregnancy: [id number] at [redacted]w[redacted]d 1. Supervision of other normal pregnancy, antepartum Good FM FH appropriate   2. Thrombocytopenia affecting pregnancy (HCC) stable  3. Prior perinatal loss in second trimester, antepartum   Term labor symptoms and general obstetric precautions including but not limited to vaginal bleeding, contractions, leaking of fluid and fetal movement were reviewed in detail with the  patient. Please refer to After Visit Summary for other counseling recommendations.   No follow-ups on file.  No future appointments.  Willodean Rosenthal, MD

## 2019-04-03 ENCOUNTER — Encounter: Payer: Self-pay | Admitting: Obstetrics & Gynecology

## 2019-04-03 ENCOUNTER — Ambulatory Visit (INDEPENDENT_AMBULATORY_CARE_PROVIDER_SITE_OTHER): Payer: Medicaid Other | Admitting: Obstetrics & Gynecology

## 2019-04-03 ENCOUNTER — Other Ambulatory Visit: Payer: Self-pay

## 2019-04-03 VITALS — BP 139/87 | HR 83 | Temp 98.4°F | Wt 189.0 lb

## 2019-04-03 DIAGNOSIS — D696 Thrombocytopenia, unspecified: Secondary | ICD-10-CM

## 2019-04-03 DIAGNOSIS — Z3A38 38 weeks gestation of pregnancy: Secondary | ICD-10-CM

## 2019-04-03 DIAGNOSIS — O09293 Supervision of pregnancy with other poor reproductive or obstetric history, third trimester: Secondary | ICD-10-CM | POA: Diagnosis not present

## 2019-04-03 DIAGNOSIS — O99113 Other diseases of the blood and blood-forming organs and certain disorders involving the immune mechanism complicating pregnancy, third trimester: Secondary | ICD-10-CM

## 2019-04-03 DIAGNOSIS — O99119 Other diseases of the blood and blood-forming organs and certain disorders involving the immune mechanism complicating pregnancy, unspecified trimester: Secondary | ICD-10-CM

## 2019-04-03 DIAGNOSIS — Z3480 Encounter for supervision of other normal pregnancy, unspecified trimester: Secondary | ICD-10-CM

## 2019-04-03 DIAGNOSIS — O09292 Supervision of pregnancy with other poor reproductive or obstetric history, second trimester: Secondary | ICD-10-CM

## 2019-04-03 LAB — POCT URINALYSIS DIPSTICK OB
Appearance: NORMAL
Bilirubin, UA: NEGATIVE
Glucose, UA: NEGATIVE
Ketones, UA: NEGATIVE
Nitrite, UA: NEGATIVE
POC,PROTEIN,UA: NEGATIVE
Spec Grav, UA: >=1.03 — AB (ref 1.010–1.025)
Urobilinogen, UA: NEGATIVE E.U./dL — AB
pH, UA: 6.5 (ref 5.0–8.0)

## 2019-04-03 NOTE — Progress Notes (Signed)
   PRENATAL VISIT NOTE  Subjective:  Teresa Blanchard is a 29 y.o. [phone] at [redacted]w[redacted]d being seen today for ongoing prenatal care.  She is currently monitored for the following issues for this low-risk pregnancy and has History of 1 spontaneous abortion; Prior perinatal loss in second trimester, antepartum; Supervision of other normal pregnancy, antepartum; and Thrombocytopenia affecting pregnancy (HCC) on their problem list.  Patient reports Pt reports swelling in face and hands and feet by late day.  No HA or vision change. .  Contractions: Irregular. Vag. Bleeding: None.  Movement: Present. Denies leaking of fluid.   The following portions of the patient's history were reviewed and updated as appropriate: allergies, current medications, past family history, past medical history, past social history, past surgical history and problem list.   Objective:   Vitals:   04/03/19 1304  BP: 139/87  Pulse: 83  Temp: 98.4 F (36.9 C)  Weight: 189 lb (85.7 kg)    Fetal Status:     Movement: Present     General:  Alert, oriented and cooperative. Patient is in no acute distress.  Skin: Skin is warm and dry. No rash noted.   Cardiovascular: Normal heart rate noted  Respiratory: Normal respiratory effort, no problems with respiration noted  Abdomen: Soft, gravid, appropriate for gestational age.  Pain/Pressure: Present     Pelvic: Cervical exam deferred        Extremities: Normal range of motion.  Edema: Trace  Mental Status: Normal mood and affect. Normal behavior. Normal judgment and thought content.   Assessment and Plan:  Pregnancy: [id number] at [redacted]w[redacted]d 1. Supervision of other normal pregnancy, antepartum Occ ctx. FH and FHR UA: no protein Reviewed labor precautions.    2. Thrombocytopenia affecting pregnancy (HCC)  3. Prior perinatal loss in second trimester, antepartum  Term labor symptoms and general obstetric precautions including but not limited to vaginal bleeding, contractions,  leaking of fluid and fetal movement were reviewed in detail with the patient. Please refer to After Visit Summary for other counseling recommendations.   Return in about 1 week (around 04/10/2019) for in person.  No future appointments.  Willodean Rosenthal, MD

## 2019-04-04 DIAGNOSIS — Z3A39 39 weeks gestation of pregnancy: Secondary | ICD-10-CM | POA: Diagnosis not present

## 2019-04-04 DIAGNOSIS — O4202 Full-term premature rupture of membranes, onset of labor within 24 hours of rupture: Secondary | ICD-10-CM | POA: Diagnosis not present

## 2019-04-10 ENCOUNTER — Encounter: Payer: Medicaid Other | Admitting: Obstetrics and Gynecology

## 2019-04-29 ENCOUNTER — Telehealth: Payer: Self-pay | Admitting: *Deleted

## 2019-04-29 NOTE — Telephone Encounter (Signed)
Not able to leave a message due to no voicemail set up.

## 2019-06-24 DIAGNOSIS — H5213 Myopia, bilateral: Secondary | ICD-10-CM | POA: Diagnosis not present

## 2019-12-25 ENCOUNTER — Other Ambulatory Visit (HOSPITAL_COMMUNITY)
Admission: RE | Admit: 2019-12-25 | Discharge: 2019-12-25 | Disposition: A | Discharge status: Home / Self Care | Payer: Medicaid Other | Source: Ambulatory Visit | Attending: Family Medicine | Admitting: Family Medicine

## 2019-12-25 ENCOUNTER — Telehealth: Payer: Self-pay | Admitting: *Deleted

## 2019-12-25 ENCOUNTER — Emergency Department
Admission: EM | Admit: 2019-12-25 | Discharge: 2019-12-25 | Disposition: A | Discharge status: Home / Self Care | Payer: Medicaid Other | Source: Home / Self Care | Attending: Family Medicine | Admitting: Family Medicine

## 2019-12-25 DIAGNOSIS — Z113 Encounter for screening for infections with a predominantly sexual mode of transmission: Secondary | ICD-10-CM | POA: Insufficient documentation

## 2019-12-25 DIAGNOSIS — N898 Other specified noninflammatory disorders of vagina: Secondary | ICD-10-CM

## 2019-12-25 MED ORDER — FLUCONAZOLE 150 MG PO TABS: ORAL_TABLET | ORAL | 0 refills | Status: DC

## 2019-12-25 NOTE — Telephone Encounter (Signed)
Returned call from 9:32 AM. Patient is having yeast infection symptoms and will call her PCP or try an OTC medication. Will call and schedule with [facility] if needed.

## 2019-12-25 NOTE — ED Triage Notes (Signed)
Pt c/o vaginal itching and abnormal discharge since last night. Called OB today and wanted her to be seen to r/o yeast infection.

## 2019-12-25 NOTE — ED Provider Notes (Signed)
[facility]   [id number] 12/25/19 Arrival Time: 1306  ASSESSMENT & PLAN:  1. Vaginal itching   2. Vaginal discharge     Is not concerned re: STI. Cytology for yeast sent. Begin: Meds ordered this encounter  Medications  . fluconazole (DIFLUCAN) 150 MG tablet    Sig: Take one tablet by mouth as a single dose. May repeat in 3 days if symptoms persist.    Dispense:  2 tablet    Refill:  0    Follow-up Information    [facility].   Specialty: Urgent Care Why: If needed. Contact information: [address] [place] [address] [phone]              Reviewed expectations re: course of current medical issues. Questions answered. Outlined signs and symptoms indicating need for more acute intervention. Patient verbalized understanding. After Visit Summary given.   SUBJECTIVE:  Teresa Blanchard is a 29 y.o. female who presents with complaint of vaginal itching with ivory/clumpy d/c. First noted yesterday evening. No specific aggravating or alleviating factors reported. Denies: urinary frequency, dysuria and gross hematuria. Afebrile. No abdominal or pelvic pain. Normal PO intake wihout n/v. No genital rashes or lesions. No STI concerns.  Patient's last menstrual period was 12/01/2019 (approximate).   OBJECTIVE:  Vitals:   12/25/19 1315  BP: 122/83  Pulse: 89  Resp: 18  Temp: 98.2 F (36.8 C)  TempSrc: Oral  SpO2: 99%     General appearance: alert, cooperative, appears stated age and no distress Lungs: unlabored respirations; speaks full sentences without difficulty GU: deferred Skin: warm and dry Psychological: alert and cooperative; normal mood and affect.    Labs Reviewed  CERVICOVAGINAL ANCILLARY ONLY    No Known Allergies  Past Medical History:  Diagnosis Date  . GERD (gastroesophageal reflux disease)   . Medical history non-contributory   . Supervision of high risk  pregnancy, antepartum 10/29/2015    Clinic   Prenatal Labs Dating  11-12 week [place] Blood type: O/POS/-- (10/27 9371)  Genetic Screen 1 Screen:Nml  AFP: Neg     NIPS: Antibody:NEG (10/27 6967) Anatomic [place] 19 wks, echogenic focus, otherwise nml Rubella: 6.95 (10/27 0907) GTT Early:    n/a         Third trimester: Nml RPR: NON REAC (10/27 8938)  Flu vaccine Declines HBsAg: NEGATIVE (10/27 0907)  TDaP vaccine Declines ([place]   Family History  Problem Relation Age of Onset  . Breast cancer Other    Social History   Socioeconomic History  . Marital status: Single    Spouse name: Not on file  . Number of children: Not on file  . Years of education: Not on file  . Highest education level: Not on file  Occupational History  . Not on file  Tobacco Use  . Smoking status: Light Tobacco Smoker    Packs/day: 0.25    Types: Cigarettes  . Smokeless tobacco: Never Used  Vaping Use  . Vaping Use: Never used  Substance and Sexual Activity  . Alcohol use: No  . Drug use: No  . Sexual activity: Not Currently    Birth control/protection: None  Other Topics Concern  . Not on file  Social History Narrative  . Not on file   Social Determinants of Health   Financial Resource Strain: Not on file  Food Insecurity: Not on file  Transportation Needs: Not on file  Physical Activity: Not on file  Stress:  Not on file  Social Connections: Not on file  Intimate Partner Violence: Not on file          Mardella Layman, MD 12/25/19 1326

## 2019-12-29 LAB — CERVICOVAGINAL ANCILLARY ONLY
Candida Glabrata: NEGATIVE
Candida Vaginitis: POSITIVE — AB
Comment: NEGATIVE
Comment: NEGATIVE

## 2020-10-18 ENCOUNTER — Other Ambulatory Visit: Payer: Self-pay

## 2020-10-18 ENCOUNTER — Emergency Department
Admission: RE | Admit: 2020-10-18 | Discharge: 2020-10-18 | Disposition: A | Discharge status: Home / Self Care | Payer: Medicaid Other | Source: Ambulatory Visit

## 2020-10-18 VITALS — BP 106/68 | HR 84 | Temp 98.4°F

## 2020-10-18 DIAGNOSIS — J0100 Acute maxillary sinusitis, unspecified: Secondary | ICD-10-CM | POA: Diagnosis not present

## 2020-10-18 DIAGNOSIS — J309 Allergic rhinitis, unspecified: Secondary | ICD-10-CM

## 2020-10-18 DIAGNOSIS — R059 Cough, unspecified: Secondary | ICD-10-CM

## 2020-10-18 MED ORDER — BENZONATATE 200 MG PO CAPS: 200.0000 mg | ORAL_CAPSULE | Freq: Three times a day (TID) | ORAL | 0 refills | Status: AC | PRN

## 2020-10-18 MED ORDER — PREDNISONE 20 MG PO TABS: ORAL_TABLET | ORAL | 0 refills | Status: DC

## 2020-10-18 MED ORDER — AMOXICILLIN-POT CLAVULANATE 875-125 MG PO TABS: 1.0000 | ORAL_TABLET | Freq: Two times a day (BID) | ORAL | 0 refills | Status: AC

## 2020-10-18 MED ORDER — FEXOFENADINE HCL 180 MG PO TABS: 180.0000 mg | ORAL_TABLET | Freq: Every day | ORAL | 0 refills | Status: DC

## 2020-10-18 NOTE — Discharge Instructions (Addendum)
Advised/instructed patient to take medication as directed with food to completion.  Advised patient to take Prednisone and Allegra with first dose of antibiotic for 5 of 10 days.  Advised/instructed patient may take [employer] daily, or as needed for cough.  Encouraged patient increase daily water intake while taking these medications.

## 2020-10-18 NOTE — ED Provider Notes (Signed)
[facility]    CSN: [id number] Arrival date & time: 10/18/20  1659      History   Chief Complaint Chief Complaint  Patient presents with   Cough   Nasal Congestion   Facial Pain    HPI Teresa Blanchard is a 30 y.o. adult.   HPI 30 year old female presents with sinus nasal congestion, cough, since sinus pressure for 1 week.  Reports her son is currently sick with RSV.  Past Medical History:  Diagnosis Date   GERD (gastroesophageal reflux disease)    Medical history non-contributory    Supervision of high risk pregnancy, antepartum 10/29/2015    Clinic  [facility] Prenatal Labs Dating  11-12 week [place] Blood type: O/POS/-- (10/27 0907)  Genetic Screen 1 Screen:Nml  AFP: Neg     NIPS: Antibody:NEG (10/27 0907) Anatomic [place] 19 wks, echogenic focus, otherwise nml Rubella: 6.95 (10/27 0907) GTT Early:    n/a         Third trimester: Nml RPR: NON REAC (10/27 7829)  Flu vaccine Declines HBsAg: NEGATIVE (10/27 0907)  TDaP vaccine Declines (Hando    Patient Active Problem List   Diagnosis Date Noted   Thrombocytopenia affecting pregnancy (HCC) 01/07/2019   Supervision of other normal pregnancy, antepartum 09/18/2018   Prior perinatal loss in second trimester, antepartum 01/07/2016   History of 1 spontaneous abortion 10/29/2015    Past Surgical History:  Procedure Laterality Date   DILATION AND CURETTAGE, DIAGNOSTIC / THERAPEUTIC      OB History     Gravida  4   Para  2   Term  2   Preterm      AB  1   Living  2      SAB  1   IAB      Ectopic      Multiple  0   Live Births  2            Home Medications    Prior to Admission medications   Medication Sig Start Date End Date Taking? Authorizing Provider  amoxicillin-clavulanate (AUGMENTIN) 875-125 MG tablet Take 1 tablet by mouth 2 (two) times daily for 10 days. 10/18/20 10/28/20 Yes Trevor Iha, FNP  benzonatate (TESSALON) 200 MG capsule Take 1 capsule (200 mg total) by mouth 3  (three) times daily as needed for up to 7 days for cough. 10/18/20 10/25/20 Yes Trevor Iha, FNP  fexofenadine [facility] ALLERGY) 180 MG tablet Take 1 tablet (180 mg total) by mouth daily for 15 days. 10/18/20 11/02/20 Yes Trevor Iha, FNP  predniSONE (DELTASONE) 20 MG tablet Take 3 tabs PO x 5 days. 10/18/20  Yes Trevor Iha, FNP  albuterol (VENTOLIN HFA) 108 (90 Base) MCG/ACT inhaler Inhale 1-2 puffs into the lungs every 6 (six) hours as needed for wheezing or shortness of breath. 12/18/18   Merrilee Jansky, MD  fluconazole (DIFLUCAN) 150 MG tablet Take one tablet by mouth as a single dose. May repeat in 3 days if symptoms persist. 12/25/19   Mardella Layman, MD  pantoprazole (PROTONIX) 20 MG tablet Take 1 tablet (20 mg total) by mouth daily. 12/05/18   Constant, Peggy, MD  Prenatal Vit-Fe Fumarate-FA (PRENATAL MULTIVITAMIN) TABS tablet Take 1 tablet by mouth daily at 12 noon.    [provider]    Family History Family History  Problem Relation Age of Onset   Breast cancer Other     Social History Social History   Tobacco Use   Smoking status: Light Smoker  Packs/day: 0.25    Types: Cigarettes   Smokeless tobacco: Never  Vaping Use   Vaping Use: Never used  Substance Use Topics   Alcohol use: No   Drug use: No     Allergies   Patient has no known allergies.   Review of Systems Review of Systems  HENT:  Positive for congestion, postnasal drip, sinus pressure and sinus pain.   Respiratory:  Positive for cough.   All other systems reviewed and are negative.   Physical Exam Triage Vital Signs ED Triage Vitals  Enc Vitals Group     BP 10/18/20 1724 106/68     Pulse Rate 10/18/20 1724 84     Resp --      Temp 10/18/20 1724 98.4 F (36.9 C)     Temp Source 10/18/20 1724 Oral     SpO2 10/18/20 1724 100 %     Weight --      Height --      Head Circumference --      Peak Flow --      Pain Score 10/18/20 1727 0     Pain Loc --      Pain Edu? --       Excl. in GC? --    No data found.  Updated Vital Signs BP 106/68 (BP Location: Left Arm)   Pulse 84   Temp 98.4 F (36.9 C) (Oral)   LMP 10/02/2020 (Exact Date)   SpO2 100%      Physical Exam Vitals and nursing note reviewed.  Constitutional:      General: She is not in acute distress.    Appearance: Normal appearance. She is not ill-appearing.  HENT:     Head: Normocephalic and atraumatic.     Right Ear: Tympanic membrane and external ear normal.     Left Ear: Tympanic membrane and external ear normal.     Ears:     Comments: Eustachian tube dysfunction noted bilaterally    Nose: Nose normal.     Comments: Turbinates are erythematous/edematous    Mouth/Throat:     Mouth: Mucous membranes are moist.     Pharynx: Oropharynx is clear.     Comments: Moderate amount of clear drainage of posterior oropharynx noted Eyes:     Extraocular Movements: Extraocular movements intact.     Conjunctiva/sclera: Conjunctivae normal.     Pupils: Pupils are equal, round, and reactive to light.  Cardiovascular:     Rate and Rhythm: Normal rate and regular rhythm.     Pulses: Normal pulses.     Heart sounds: Normal heart sounds.  Pulmonary:     Effort: Pulmonary effort is normal.     Breath sounds: Normal breath sounds. No wheezing, rhonchi or rales.  Musculoskeletal:        General: Normal range of motion.     Cervical back: Normal range of motion and neck supple.  Lymphadenopathy:     Cervical: No cervical adenopathy.  Skin:    General: Skin is warm and dry.  Neurological:     General: No focal deficit present.     Mental Status: She is alert and oriented to person, place, and time. Mental status is at baseline.  Psychiatric:        Mood and Affect: Mood normal.        Behavior: Behavior normal.        Thought Content: Thought content normal.     UC Treatments / Results  Labs (all labs  ordered are listed, but only abnormal results are displayed) Labs Reviewed - No data to  display  EKG   Radiology No results found.  Procedures Procedures (including critical care time)  Medications Ordered in UC Medications - No data to display  Initial Impression / Assessment and Plan / UC Course  I have reviewed the triage vital signs and the nursing notes.  Pertinent labs & imaging results that were available during my care of the patient were reviewed by me and considered in my medical decision making (see chart for details).     MDM: 1.  Acute maxillary sinusitis-Rx'd Allegra; 2.  Cough-Rx'd prednisone burst and Tessalon Perles; 3.  Allergic rhinitis-Rx'd Allegra. Advised/instructed patient to take medication as directed with food to completion.  Advised patient to take Prednisone and Allegra with first dose of antibiotic for 5 of 10 days.  Advised/instructed patient may take [employer] daily, or as needed for cough.  Encouraged patient increase daily water intake while taking these medications.  Patient discharged home, hemodynamically stable. Final Clinical Impressions(s) / UC Diagnoses   Final diagnoses:  Acute maxillary sinusitis, recurrence not specified  Cough, unspecified type  Allergic rhinitis, unspecified seasonality, unspecified trigger     Discharge Instructions      Advised/instructed patient to take medication as directed with food to completion.  Advised patient to take Prednisone and Allegra with first dose of antibiotic for 5 of 10 days.  Advised/instructed patient may take [employer] daily, or as needed for cough.  Encouraged patient increase daily water intake while taking these medications.     ED Prescriptions     Medication Sig Dispense Auth. Provider   amoxicillin-clavulanate (AUGMENTIN) 875-125 MG tablet Take 1 tablet by mouth 2 (two) times daily for 10 days. 20 tablet Trevor Iha, FNP   fexofenadine [facility] ALLERGY) 180 MG tablet Take 1 tablet (180 mg total) by mouth daily for 15 days. 15 tablet Trevor Iha, FNP    predniSONE (DELTASONE) 20 MG tablet Take 3 tabs PO x 5 days. 15 tablet Trevor Iha, FNP   benzonatate (TESSALON) 200 MG capsule Take 1 capsule (200 mg total) by mouth 3 (three) times daily as needed for up to 7 days for cough. 30 capsule Trevor Iha, FNP      PDMP not reviewed this encounter.   Trevor Iha, FNP 10/18/20 1842

## 2020-10-18 NOTE — ED Triage Notes (Signed)
Pt c/o cough, congestion and sinus pressure x 1 week. Was sick with similar sxs 3 weeks ago. Resolved with OTC meds. Son currently sick with RSV. Denies fever.

## 2020-10-29 ENCOUNTER — Other Ambulatory Visit: Payer: Self-pay

## 2020-10-29 ENCOUNTER — Telehealth: Payer: Self-pay | Admitting: Family Medicine

## 2020-10-29 ENCOUNTER — Emergency Department
Admission: EM | Admit: 2020-10-29 | Discharge: 2020-10-29 | Discharge status: Against Medical Advice | Payer: Medicaid Other | Source: Home / Self Care | Attending: Family Medicine | Admitting: Family Medicine

## 2020-10-29 ENCOUNTER — Emergency Department (INDEPENDENT_AMBULATORY_CARE_PROVIDER_SITE_OTHER): Payer: Medicaid Other

## 2020-10-29 DIAGNOSIS — R053 Chronic cough: Secondary | ICD-10-CM | POA: Diagnosis not present

## 2020-10-29 MED ORDER — AZITHROMYCIN 250 MG PO TABS: ORAL_TABLET | ORAL | 0 refills | Status: DC

## 2020-10-29 NOTE — ED Notes (Signed)
Note entry at 1411:Pt had to leave at 1300 due to a childcare issue/pick up. Per Dr Cathren Harsh, he will call [facility] with results. No AVS at the time of discharge due to CXR not resulted. Pt discharged  by Dr Cathren Harsh prior to this RN's arrival to [facility].

## 2020-10-29 NOTE — Discharge Instructions (Signed)
Take plain guaifenesin (1200mg  extended release tabs such as Mucinex) twice daily, with plenty of water, for cough and congestion.   Use Flonase nasal spray each morning. Stop all antihistamines (Allegra, etc) for now, and other non-prescription cough/cold preparations.

## 2020-10-29 NOTE — Telephone Encounter (Signed)
Discussed normal chest x-ray with patient. Because of prolonged cough, will begin Z-pack. Take plain guaifenesin (1200mg  extended release tabs such as Mucinex) twice daily, with plenty of water, for cough and congestion.   Use Flonase nasal spray each morning. Stop all antihistamines (Allegra, etc) for now, and other non-prescription cough/cold preparations.  Followup with Family Doctor if not improved in one week.

## 2020-10-29 NOTE — ED Triage Notes (Signed)
Pt present coughing with congestion. Pt was recently seen and prescribed an antibiotics. Her symptom has not gotten better.

## 2020-10-30 NOTE — ED Provider Notes (Signed)
[facility]    CSN: [id number] Arrival date & time: 10/29/20  1056      History   Chief Complaint Chief Complaint  Patient presents with   Cough    HPI Teresa Blanchard is a 30 y.o. adult.   Patient complains of persistent non-productive cough and sinus congestion after being treated here 11 days ago for maxillary sinusitis, unspecified cough, and allergic rhinitis.  She denies pleuritic pain and shortness of breath.  She continues to smoke.  The history is provided by the patient.   Past Medical History:  Diagnosis Date   GERD (gastroesophageal reflux disease)    Medical history non-contributory    Supervision of high risk pregnancy, antepartum 10/29/2015    Clinic  [place] Prenatal Labs Dating  11-12 week [place] Blood type: O/POS/-- (10/27 0907)  Genetic Screen 1 Screen:Nml  AFP: Neg     NIPS: Antibody:NEG (10/27 0907) Anatomic [place] 19 wks, echogenic focus, otherwise nml Rubella: 6.95 (10/27 0907) GTT Early:    n/a         Third trimester: Nml RPR: NON REAC (10/27 1791)  Flu vaccine Declines HBsAg: NEGATIVE (10/27 0907)  TDaP vaccine Declines ([facility]    Patient Active Problem List   Diagnosis Date Noted   Thrombocytopenia affecting pregnancy (HCC) 01/07/2019   Supervision of other normal pregnancy, antepartum 09/18/2018   Prior perinatal loss in second trimester, antepartum 01/07/2016   History of 1 spontaneous abortion 10/29/2015    Past Surgical History:  Procedure Laterality Date   DILATION AND CURETTAGE, DIAGNOSTIC / THERAPEUTIC      OB History     Gravida  4   Para  2   Term  2   Preterm      AB  1   Living  2      SAB  1   IAB      Ectopic      Multiple  0   Live Births  2            Home Medications    Prior to Admission medications   Medication Sig Start Date End Date Taking? Authorizing Provider  albuterol (VENTOLIN HFA) 108 (90 Base) MCG/ACT inhaler Inhale 1-2 puffs into the lungs every 6 (six) hours as needed for  wheezing or shortness of breath. 12/18/18   Lamptey, Britta Mccreedy, MD  azithromycin (ZITHROMAX Z-PAK) 250 MG tablet Take 2 tabs today; then begin one tab once daily for 4 more days. 10/29/20   Lattie Haw, MD  fexofenadine (ALLEGRA ALLERGY) 180 MG tablet Take 1 tablet (180 mg total) by mouth daily for 15 days. 10/18/20 11/02/20  Trevor Iha, FNP  fluconazole (DIFLUCAN) 150 MG tablet Take one tablet by mouth as a single dose. May repeat in 3 days if symptoms persist. 12/25/19   Mardella Layman, MD  pantoprazole (PROTONIX) 20 MG tablet Take 1 tablet (20 mg total) by mouth daily. 12/05/18   Constant, Peggy, MD  Prenatal Vit-Fe Fumarate-FA (PRENATAL MULTIVITAMIN) TABS tablet Take 1 tablet by mouth daily at 12 noon.    [provider]    Family History Family History  Problem Relation Age of Onset   Breast cancer Other     Social History Social History   Tobacco Use   Smoking status: Light Smoker    Packs/day: 0.25    Types: Cigarettes   Smokeless tobacco: Never  Vaping Use   Vaping Use: Never used  Substance Use Topics   Alcohol use: No  Drug use: No     Allergies   Patient has no known allergies.   Review of Systems Review of Systems + sore throat + cough No pleuritic pain ? wheezing + nasal congestion + post-nasal drainage + sinus pain/pressure No itchy/red eyes No earache No hemoptysis No SOB No fever/chills No nausea No vomiting No abdominal pain No diarrhea No urinary symptoms No skin rash No fatigue No myalgias No headache   Physical Exam Triage Vital Signs ED Triage Vitals  Enc Vitals Group     BP 10/29/20 1111 117/80     Pulse Rate 10/29/20 1111 78     Resp 10/29/20 1111 16     Temp 10/29/20 1111 98.1 F (36.7 C)     Temp Source 10/29/20 1111 Oral     SpO2 10/29/20 1111 100 %     Weight --      Height --      Head Circumference --      Peak Flow --      Pain Score 10/29/20 1112 0     Pain Loc --      Pain Edu? --      Excl. in  GC? --    No data found.  Updated Vital Signs BP 117/80 (BP Location: Left Arm)   Pulse 78   Temp 98.1 F (36.7 C) (Oral)   Resp 16   LMP 10/02/2020 (Exact Date)   SpO2 100%   Visual Acuity Right Eye Distance:   Left Eye Distance:   Bilateral Distance:    Right Eye Near:   Left Eye Near:    Bilateral Near:     Physical Exam Nursing notes and Vital Signs reviewed. Appearance:  Patient appears stated age, and in no acute distress Eyes:  Pupils are equal, round, and reactive to light and accomodation.  Extraocular movement is intact.  Conjunctivae are not inflamed  Ears:  Canals normal.  Tympanic membranes normal.  Nose:  Mildly congested turbinates.  No sinus tenderness.   Pharynx:  Normal Neck:  Supple.  No adenopathy Lungs:  Faint wheezes heard left posterior chest.  Breath sounds are equal.  Moving air well. Heart:  Regular rate and rhythm without murmurs, rubs, or gallops.  Abdomen:  Nontender without masses or hepatosplenomegaly.  Bowel sounds are present.  No CVA or flank tenderness.  Extremities:  No edema.  Skin:  No rash present.   UC Treatments / Results  Labs (all labs ordered are listed, but only abnormal results are displayed) Labs Reviewed - No data to display  EKG   Radiology DG Chest 2 View  Result Date: 10/29/2020 CLINICAL DATA:  Persistent cough for 3 weeks EXAM: CHEST - 2 VIEW COMPARISON:  None. FINDINGS: The heart size and mediastinal contours are within normal limits. Both lungs are clear. The visualized skeletal structures are unremarkable. IMPRESSION: No active cardiopulmonary disease. Electronically Signed   By: Larose Hires D.O.   On: 10/29/2020 12:32    Procedures Procedures (including critical care time)  Medications Ordered in UC Medications - No data to display  Initial Impression / Assessment and Plan / UC Course  I have reviewed the triage vital signs and the nursing notes.  Pertinent labs & imaging results that were available  during my care of the patient were reviewed by me and considered in my medical decision making (see chart for details).    Patient had to leave clinic prior to completion of her evaluation in order to pick up  her child at school.  Negative chest x-ray reassuring.  Despite recent completion of course of Augmentin and prednisone she continues to have a cough and faint wheezing in left chest on exam. Will begin Z-pak to cover possible atypical agents. Followup with Family Doctor if not improved in one week.   Final Clinical Impressions(s) / UC Diagnoses   Final diagnoses:  Persistent cough     Discharge Instructions      Take plain guaifenesin (1200mg  extended release tabs such as Mucinex) twice daily, with plenty of water, for cough and congestion.   Use Flonase nasal spray each morning. Stop all antihistamines (Allegra, etc) for now, and other non-prescription cough/cold preparations.         ED Prescriptions   None       , MD 10/30/20 [phone]

## 2021-06-23 ENCOUNTER — Emergency Department (INDEPENDENT_AMBULATORY_CARE_PROVIDER_SITE_OTHER)
Admission: EM | Admit: 2021-06-23 | Discharge: 2021-06-23 | Disposition: A | Discharge status: Home / Self Care | Payer: Medicaid Other | Source: Home / Self Care

## 2021-06-23 DIAGNOSIS — J3089 Other allergic rhinitis: Secondary | ICD-10-CM | POA: Diagnosis not present

## 2021-06-23 DIAGNOSIS — R059 Cough, unspecified: Secondary | ICD-10-CM

## 2021-06-23 MED ORDER — FEXOFENADINE HCL 180 MG PO TABS: 180.0000 mg | ORAL_TABLET | Freq: Every day | ORAL | 0 refills | Status: DC

## 2021-06-23 MED ORDER — METHYLPREDNISOLONE 4 MG PO TBPK: ORAL_TABLET | ORAL | 0 refills | Status: DC

## 2021-06-23 MED ORDER — BENZONATATE 200 MG PO CAPS: 200.0000 mg | ORAL_CAPSULE | Freq: Three times a day (TID) | ORAL | 0 refills | Status: AC | PRN

## 2021-06-23 MED ORDER — AZITHROMYCIN 250 MG PO TABS: 250.0000 mg | ORAL_TABLET | Freq: Every day | ORAL | 0 refills | Status: DC

## 2021-06-23 NOTE — ED Provider Notes (Signed)
[facility]    CSN: [id number] Arrival date & time: 06/23/21  1825      History   Chief Complaint Chief Complaint  Patient presents with   Cough    HPI Jadyn Brasher is a 31 y.o. adult.   HPI 31 year old female presents with cough for 1 day.  PMH significant for obesity, GERD and current/daily cigarette smoker.  Past Medical History:  Diagnosis Date   GERD (gastroesophageal reflux disease)    Medical history non-contributory    Supervision of high risk pregnancy, antepartum 10/29/2015    Clinic  [place] Prenatal Labs Dating  11-12 week [place] Blood type: O/POS/-- (10/27 0907)  Genetic Screen 1 Screen:Nml  AFP: Neg     NIPS: Antibody:NEG (10/27 0907) Anatomic [place] 19 wks, echogenic focus, otherwise nml Rubella: 6.95 (10/27 0907) GTT Early:    n/a         Third trimester: Nml RPR: NON REAC (10/27 4920)  Flu vaccine Declines HBsAg: NEGATIVE (10/27 0907)  TDaP vaccine Declines (Hando    Patient Active Problem List   Diagnosis Date Noted   Thrombocytopenia affecting pregnancy (HCC) 01/07/2019   Supervision of other normal pregnancy, antepartum 09/18/2018   Prior perinatal loss in second trimester, antepartum 01/07/2016   History of 1 spontaneous abortion 10/29/2015    Past Surgical History:  Procedure Laterality Date   DILATION AND CURETTAGE, DIAGNOSTIC / THERAPEUTIC      OB History     Gravida  4   Para  2   Term  2   Preterm      AB  1   Living  2      SAB  1   IAB      Ectopic      Multiple  0   Live Births  2            Home Medications    Prior to Admission medications   Medication Sig Start Date End Date Taking? Authorizing Provider  azithromycin (ZITHROMAX) 250 MG tablet Take 1 tablet (250 mg total) by mouth daily. Take first 2 tablets together, then 1 every day until finished. 06/23/21  Yes Trevor Iha, FNP  benzonatate (TESSALON) 200 MG capsule Take 1 capsule (200 mg total) by mouth 3 (three) times daily as needed for  up to 7 days for cough. 06/23/21 06/30/21 Yes Trevor Iha, FNP  dextromethorphan (DELSYM) 30 MG/5ML liquid Take by mouth as needed for cough.   Yes [provider]  fexofenadine (ALLEGRA ALLERGY) 180 MG tablet Take 1 tablet (180 mg total) by mouth daily for 15 days. 06/23/21 07/08/21 Yes Trevor Iha, FNP  methylPREDNISolone (MEDROL DOSEPAK) 4 MG TBPK tablet Take as directed 06/23/21  Yes Trevor Iha, FNP  fluconazole (DIFLUCAN) 150 MG tablet Take one tablet by mouth as a single dose. May repeat in 3 days if symptoms persist. 12/25/19   Mardella Layman, MD  pantoprazole (PROTONIX) 20 MG tablet Take 1 tablet (20 mg total) by mouth daily. 12/05/18   Constant, Peggy, MD  Prenatal Vit-Fe Fumarate-FA (PRENATAL MULTIVITAMIN) TABS tablet Take 1 tablet by mouth daily at 12 noon.    [provider]    Family History Family History  Problem Relation Age of Onset   Thyroid disease Mother    Heart disease Father    Breast cancer Other     Social History Social History   Tobacco Use   Smoking status: Light Smoker    Packs/day: 0.75    Years: 16.00  Total pack years: 12.00    Types: Cigarettes   Smokeless tobacco: Never  Vaping Use   Vaping Use: Never used  Substance Use Topics   Alcohol use: No   Drug use: No     Allergies   Patient has no known allergies.   Review of Systems Review of Systems  Respiratory:  Positive for cough.   All other systems reviewed and are negative.    Physical Exam Triage Vital Signs ED Triage Vitals  Enc Vitals Group     BP      Pulse      Resp      Temp      Temp src      SpO2      Weight      Height      Head Circumference      Peak Flow      Pain Score      Pain Loc      Pain Edu?      Excl. in GC?    No data found.  Updated Vital Signs BP 110/72 (BP Location: Right Arm)   Pulse 73   Temp 98.4 F (36.9 C) (Oral)   Resp 20   Ht 5\' 4"  (1.626 m)   Wt 165 lb (74.8 kg)   LMP 06/17/2021 (Exact Date)   SpO2 99%    BMI 28.32 kg/m     Physical Exam Vitals and nursing note reviewed.  Constitutional:      General: She is not in acute distress.    Appearance: Normal appearance. She is obese. She is not ill-appearing.  HENT:     Head: Normocephalic and atraumatic.     Right Ear: Tympanic membrane, ear canal and external ear normal.     Left Ear: Tympanic membrane, ear canal and external ear normal.     Mouth/Throat:     Mouth: Mucous membranes are moist.     Pharynx: Oropharynx is clear.     Comments: Moderate to significant amount of clear drainage of posterior oropharynx noted Eyes:     Extraocular Movements: Extraocular movements intact.     Conjunctiva/sclera: Conjunctivae normal.     Pupils: Pupils are equal, round, and reactive to light.  Cardiovascular:     Rate and Rhythm: Normal rate and regular rhythm.     Pulses: Normal pulses.     Heart sounds: Normal heart sounds.  Pulmonary:     Effort: Pulmonary effort is normal.     Breath sounds: Normal breath sounds. No wheezing, rhonchi or rales.     Comments: Frequent nonproductive cough noted on exam Musculoskeletal:     Cervical back: Normal range of motion and neck supple.  Skin:    General: Skin is warm and dry.  Neurological:     General: No focal deficit present.     Mental Status: She is alert and oriented to person, place, and time.      UC Treatments / Results  Labs (all labs ordered are listed, but only abnormal results are displayed) Labs Reviewed - No data to display  EKG   Radiology No results found.  Procedures Procedures (including critical care time)  Medications Ordered in UC Medications - No data to display  Initial Impression / Assessment and Plan / UC Course  I have reviewed the triage vital signs and the nursing notes.  Pertinent labs & imaging results that were available during my care of the patient were reviewed by me and considered  in my medical decision making (see chart for details).      MDM: 1.  Cough-Rx'd Zithromax, Medrol Dosepak, Tessalon Perles; 2.  Allergic rhinitis-Rx'd Allegra. Instructed patient to take medication as directed with food to completion.  Instructed patient to start Medrol Dosepak tomorrow morning Friday, 6/23.  Advised may take Tessalon Perles daily or as needed for cough.  Advised may take Allegra daily with Zithromax for concurrent postnasal drainage/drip.  Encouraged patient to increase daily water intake while taking these medications.  Advised if symptoms worsen and/or unresolved please follow-up with PCP or here for further evaluation.  Patient discharged home, hemodynamically stable. Final Clinical Impressions(s) / UC Diagnoses   Final diagnoses:  Cough, unspecified type  Allergic rhinitis due to other allergic trigger, unspecified seasonality     Discharge Instructions      Instructed patient to take medication as directed with food to completion.  Instructed patient to start Medrol Dosepak tomorrow morning Friday, 6/23.  Advised may take Tessalon Perles daily or as needed for cough.  Advised may take Allegra daily with Zithromax for concurrent postnasal drainage/drip.  Encouraged patient to increase daily water intake while taking these medications.  Advised if symptoms worsen and/or unresolved please follow-up with PCP or here for further evaluation.     ED Prescriptions     Medication Sig Dispense Auth. Provider   azithromycin (ZITHROMAX) 250 MG tablet Take 1 tablet (250 mg total) by mouth daily. Take first 2 tablets together, then 1 every day until finished. 6 tablet Trevor Iha, FNP   methylPREDNISolone (MEDROL DOSEPAK) 4 MG TBPK tablet Take as directed 1 each Trevor Iha, FNP   benzonatate (TESSALON) 200 MG capsule Take 1 capsule (200 mg total) by mouth 3 (three) times daily as needed for up to 7 days for cough. 40 capsule Trevor Iha, FNP   fexofenadine [facility] ALLERGY) 180 MG tablet Take 1 tablet (180 mg total) by mouth  daily for 15 days. 15 tablet Trevor Iha, FNP      PDMP not reviewed this encounter.   Trevor Iha, FNP 06/23/21 Windell Moment

## 2021-06-23 NOTE — Discharge Instructions (Addendum)
Instructed patient to take medication as directed with food to completion.  Instructed patient to start Medrol Dosepak tomorrow morning Friday, 6/23.  Advised may take Tessalon Perles daily or as needed for cough.  Advised may take Allegra daily with Zithromax for concurrent postnasal drainage/drip.  Encouraged patient to increase daily water intake while taking these medications.  Advised if symptoms worsen and/or unresolved please follow-up with PCP or here for further evaluation.

## 2021-06-23 NOTE — ED Triage Notes (Signed)
Pt presents to Urgent Care with c/o cough since this AM; also reports chest "heaviness." Reports that throat is not sore but "irritated from coughing." Afebrile, no COVID test done.

## 2022-03-28 ENCOUNTER — Ambulatory Visit
Admission: EM | Admit: 2022-03-28 | Discharge: 2022-03-28 | Disposition: A | Discharge status: Home / Self Care | Payer: Medicaid Other | Attending: Family Medicine | Admitting: Family Medicine

## 2022-03-28 ENCOUNTER — Ambulatory Visit: Payer: Self-pay

## 2022-03-28 DIAGNOSIS — H10021 Other mucopurulent conjunctivitis, right eye: Secondary | ICD-10-CM | POA: Diagnosis not present

## 2022-03-28 MED ORDER — TOBRAMYCIN 0.3 % OP SOLN: 1.0000 [drp] | OPHTHALMIC | 0 refills | Status: DC

## 2022-03-28 NOTE — Discharge Instructions (Signed)
Pinkeye usually starts in 1 eye and then travels to the other eye.  For this reason I recommend you use the eyedrops in both eyes Use the eyedrops for 5 to 7 days until your eyes are clear

## 2022-03-28 NOTE — ED Triage Notes (Addendum)
Pt presents to Urgent Care with c/o R eye redness, irritation, and drainage since this AM. Reports two of her children have recently had pink eye. Denies problem w/ vision.

## 2022-03-28 NOTE — ED Provider Notes (Signed)
[facility]    CSN: [id number] Arrival date & time: 03/28/22  1542      History   Chief Complaint Chief Complaint  Patient presents with   Eye Problem    HPI Teresa Blanchard is a 32 y.o. adult.   HPI  She states that her 60-year-old brought home pinkeye for the family.  She has another child with pinkeye and now she woke up this morning with her right eye swollen and crusted shut with redness.  Vision is normal.  Past Medical History:  Diagnosis Date   GERD (gastroesophageal reflux disease)    Medical history non-contributory    Supervision of high risk pregnancy, antepartum 10/29/2015    Clinic  [place] Prenatal Labs Dating  11-12 week [place] Blood type: O/POS/-- (10/27 0907)  Genetic Screen 1 Screen:Nml  AFP: Neg     NIPS: Antibody:NEG (10/27 0907) Anatomic [place] 19 wks, echogenic focus, otherwise nml Rubella: 6.95 (10/27 0907) GTT Early:    n/a         Third trimester: Nml RPR: NON REAC (10/27 [id number])  Flu vaccine Declines HBsAg: NEGATIVE (10/27 0907)  TDaP vaccine Declines ([facility]    Patient Active Problem List   Diagnosis Date Noted   Thrombocytopenia affecting pregnancy ([place]) 01/07/2019   Supervision of other normal pregnancy, antepartum 09/18/2018   Prior perinatal loss in second trimester, antepartum 01/07/2016   History of 1 spontaneous abortion 10/29/2015    Past Surgical History:  Procedure Laterality Date   DILATION AND CURETTAGE, DIAGNOSTIC / THERAPEUTIC      OB History     Gravida  4   Para  2   Term  2   Preterm      AB  1   Living  2      SAB  1   IAB      Ectopic      Multiple  0   Live Births  2            Home Medications    Prior to Admission medications   Medication Sig Start Date End Date Taking? Authorizing Provider  tobramycin (TOBREX) 0.3 % ophthalmic solution Place 1 drop into both eyes every 4 (four) hours. While awake 03/28/22  Yes Raylene Everts, MD    Family History Family History  Problem  Relation Age of Onset   Thyroid disease Mother    Heart disease Father    Breast cancer Other     Social History Social History   Tobacco Use   Smoking status: Every Day    Packs/day: 0.75    Years: 16.00    Additional pack years: 0.00    Total pack years: 12.00    Types: Cigarettes   Smokeless tobacco: Never  Vaping Use   Vaping Use: Never used  Substance Use Topics   Alcohol use: No   Drug use: No     Allergies   Patient has no known allergies.   Review of Systems Review of Systems  See HPI Physical Exam Triage Vital Signs ED Triage Vitals  Enc Vitals Group     BP 03/28/22 1559 111/73     Pulse Rate 03/28/22 1559 100     Resp 03/28/22 1559 20     Temp 03/28/22 1559 98.2 F (36.8 C)     Temp Source 03/28/22 1559 Oral     SpO2 03/28/22 1559 100 %     Weight 03/28/22 1556 160 lb (72.6 kg)  Height 03/28/22 1556 5\' 4"  (1.626 m)     Head Circumference --      Peak Flow --      Pain Score 03/28/22 1554 10     Pain Loc --      Pain Edu? --      Excl. in [place]? --    No data found.  Updated Vital Signs BP 111/73 (BP Location: Right Arm)   Pulse 100   Temp 98.2 F (36.8 C) (Oral)   Resp 20   Ht 5\' 4"  (1.626 m)   Wt 72.6 kg   LMP 03/06/2022 (Exact Date)   SpO2 100%   BMI 27.46 kg/m      Physical Exam Constitutional:      General: She is not in acute distress.    Appearance: She is well-developed.  HENT:     Head: Normocephalic and atraumatic.  Eyes:     Pupils: Pupils are equal, round, and reactive to light.     Comments: Right conjunctivitis deeply erythematous.  No swelling of lids.  Lids everted no foreign body.  No stye or hordeolum.  Patient does have yellow discharge to the medial canthus.  Left eye is normal.  ENT exam otherwise normal  Cardiovascular:     Rate and Rhythm: Normal rate.  Pulmonary:     Effort: Pulmonary effort is normal. No respiratory distress.  Abdominal:     General: There is no distension.     Palpations: Abdomen is  soft.  Musculoskeletal:        General: Normal range of motion.     Cervical back: Normal range of motion.  Skin:    General: Skin is warm and dry.  Neurological:     Mental Status: She is alert.      UC Treatments / Results  Labs (all labs ordered are listed, but only abnormal results are displayed) Labs Reviewed - No data to display  EKG   Radiology No results found.  Procedures Procedures (including critical care time)  Medications Ordered in UC Medications - No data to display  Initial Impression / Assessment and Plan / UC Course  I have reviewed the triage vital signs and the nursing notes.  Pertinent labs & imaging results that were available during my care of the patient were reviewed by me and considered in my medical decision making (see chart for details).     Final Clinical Impressions(s) / UC Diagnoses   Final diagnoses:  Other mucopurulent conjunctivitis of right eye     Discharge Instructions      Pinkeye usually starts in 1 eye and then travels to the other eye.  For this reason I recommend you use the eyedrops in both eyes Use the eyedrops for 5 to 7 days until your eyes are clear   ED Prescriptions     Medication Sig Dispense Auth. Provider   tobramycin (TOBREX) 0.3 % ophthalmic solution Place 1 drop into both eyes every 4 (four) hours. While awake 5 mL Raylene Everts, MD      PDMP not reviewed this encounter.   Raylene Everts, MD 03/28/22 [phone]

## 2022-05-02 DIAGNOSIS — J069 Acute upper respiratory infection, unspecified: Secondary | ICD-10-CM | POA: Diagnosis not present

## 2022-05-02 DIAGNOSIS — R0981 Nasal congestion: Secondary | ICD-10-CM | POA: Diagnosis not present

## 2022-11-22 ENCOUNTER — Ambulatory Visit
Admission: RE | Admit: 2022-11-22 | Discharge: 2022-11-22 | Disposition: A | Discharge status: Home / Self Care | Payer: Medicaid Other | Source: Ambulatory Visit | Attending: Family Medicine | Admitting: Family Medicine

## 2022-11-22 ENCOUNTER — Other Ambulatory Visit: Payer: Self-pay

## 2022-11-22 VITALS — BP 115/75 | HR 85 | Temp 98.5°F | Resp 16

## 2022-11-22 DIAGNOSIS — R059 Cough, unspecified: Secondary | ICD-10-CM

## 2022-11-22 LAB — POC SARS CORONAVIRUS 2 AG -  ED: SARS Coronavirus 2 Ag: NEGATIVE

## 2022-11-22 MED ORDER — PREDNISONE 20 MG PO TABS: ORAL_TABLET | ORAL | 0 refills | Status: DC

## 2022-11-22 NOTE — ED Provider Notes (Signed)
[facility]    CSN: [id number] Arrival date & time: 11/22/22  1025      History   Chief Complaint Chief Complaint  Patient presents with   Cough    HPI Teresa Blanchard is a 32 y.o. adult.   HPI 32 year old female presents with cough since yesterday and sore throat since last night.  PMH significant for GERD and current everyday cigarette smoker.  Past Medical History:  Diagnosis Date   GERD (gastroesophageal reflux disease)    Medical history non-contributory    Supervision of high risk pregnancy, antepartum 10/29/2015    Clinic  [place] Prenatal Labs Dating  11-12 week [place] Blood type: O/POS/-- (10/27 0907)  Genetic Screen 1 Screen:Nml  AFP: Neg     NIPS: Antibody:NEG (10/27 0907) Anatomic [place] 19 wks, echogenic focus, otherwise nml Rubella: 6.95 (10/27 0907) GTT Early:    n/a         Third trimester: Nml RPR: NON REAC (10/27 7846)  Flu vaccine Declines HBsAg: NEGATIVE (10/27 0907)  TDaP vaccine Declines ([facility]    Patient Active Problem List   Diagnosis Date Noted   Thrombocytopenia affecting pregnancy ([facility]) 01/07/2019   Supervision of other normal pregnancy, antepartum 09/18/2018   Prior perinatal loss in second trimester, antepartum 01/07/2016   History of 1 spontaneous abortion 10/29/2015    Past Surgical History:  Procedure Laterality Date   DILATION AND CURETTAGE, DIAGNOSTIC / THERAPEUTIC      OB History     Gravida  4   Para  2   Term  2   Preterm      AB  1   Living  2      SAB  1   IAB      Ectopic      Multiple  0   Live Births  2            Home Medications    Prior to Admission medications   Medication Sig Start Date End Date Taking? Authorizing Provider  predniSONE (DELTASONE) 20 MG tablet Take 3 tabs PO daily x 5 days. 11/22/22  Yes Trevor Iha, FNP  tobramycin (TOBREX) 0.3 % ophthalmic solution Place 1 drop into both eyes every 4 (four) hours. While awake 03/28/22   Eustace Moore, MD    Family  History Family History  Problem Relation Age of Onset   Thyroid disease Mother    Heart disease Father    Breast cancer Other     Social History Social History   Tobacco Use   Smoking status: Every Day    Current packs/day: 0.75    Average packs/day: 0.8 packs/day for 16.0 years (12.0 ttl pk-yrs)    Types: Cigarettes   Smokeless tobacco: Never  Vaping Use   Vaping status: Never Used  Substance Use Topics   Alcohol use: No   Drug use: No     Allergies   Patient has no known allergies.   Review of Systems Review of Systems   Physical Exam Triage Vital Signs ED Triage Vitals  Encounter Vitals Group     BP 11/22/22 1038 115/75     Systolic BP Percentile --      Diastolic BP Percentile --      Pulse Rate 11/22/22 1038 85     Resp 11/22/22 1038 16     Temp 11/22/22 1038 98.5 F (36.9 C)     Temp src --      SpO2 11/22/22 1038 99 %  Weight --      Height --      Head Circumference --      Peak Flow --      Pain Score 11/22/22 1042 6     Pain Loc --      Pain Education --      Exclude from Growth Chart --    No data found.  Updated Vital Signs BP 115/75   Pulse 85   Temp 98.5 F (36.9 C)   Resp 16   SpO2 99%    Physical Exam Vitals and nursing note reviewed.  Constitutional:      Appearance: Normal appearance. She is normal weight.  HENT:     Head: Normocephalic and atraumatic.     Right Ear: Tympanic membrane, ear canal and external ear normal.     Left Ear: Tympanic membrane, ear canal and external ear normal.     Nose: Nose normal.     Mouth/Throat:     Mouth: Mucous membranes are dry.     Pharynx: Oropharynx is clear.  Eyes:     Extraocular Movements: Extraocular movements intact.     Conjunctiva/sclera: Conjunctivae normal.     Pupils: Pupils are equal, round, and reactive to light.  Cardiovascular:     Rate and Rhythm: Normal rate and regular rhythm.     Pulses: Normal pulses.     Heart sounds: Normal heart sounds.  Pulmonary:      Effort: Pulmonary effort is normal.     Breath sounds: Normal breath sounds. No wheezing, rhonchi or rales.     Comments: Infrequent nonproductive cough noted on exam Musculoskeletal:        General: Normal range of motion.     Cervical back: Normal range of motion and neck supple.  Skin:    General: Skin is warm and dry.  Neurological:     General: No focal deficit present.     Mental Status: She is alert and oriented to person, place, and time. Mental status is at baseline.  Psychiatric:        Mood and Affect: Mood normal.        Behavior: Behavior normal.      UC Treatments / Results  Labs (all labs ordered are listed, but only abnormal results are displayed) Labs Reviewed  POC SARS CORONAVIRUS 2 AG -  ED    EKG   Radiology No results found.  Procedures Procedures (including critical care time)  Medications Ordered in UC Medications - No data to display  Initial Impression / Assessment and Plan / UC Course  I have reviewed the triage vital signs and the nursing notes.  Pertinent labs & imaging results that were available during my care of the patient were reviewed by me and considered in my medical decision making (see chart for details).     MDM: 1. Cough-Rx'd prednisone 20 mg tablet: Take 3 tabs p.o. daily x 5 days. Advised patient to take medication as directed with food to completion.  Encouraged to increase daily water intake to 64 ounces per day while taking his medication.  Advised if symptoms worsen and/or unresolved please follow-up with PCP or here for further evaluation.  Patient discharged home, hemodynamically stable. Final Clinical Impressions(s) / UC Diagnoses   Final diagnoses:  Cough, unspecified type     Discharge Instructions      Advised patient to take medication as directed with food to completion.  Encouraged to increase daily water intake to 64 ounces  per day while taking his medication.  Advised if symptoms worsen and/or unresolved  please follow-up with PCP or here for further evaluation.     ED Prescriptions     Medication Sig Dispense Auth. Provider   predniSONE (DELTASONE) 20 MG tablet Take 3 tabs PO daily x 5 days. 15 tablet Trevor Iha, FNP      PDMP not reviewed this encounter.   Trevor Iha, FNP 11/22/22 1205

## 2022-11-22 NOTE — ED Triage Notes (Signed)
Cough since yesterday, scratchy throat since last night. Has c/o chest discomfort that goes into back that is worse with coughing or a deep breath. No fevers.

## 2022-11-22 NOTE — Discharge Instructions (Addendum)
 Advised patient to take medication as directed with food to completion.  Encouraged to increase daily water intake to 64 ounces per day while taking his medication.  Advised if symptoms worsen and/or unresolved please follow-up with PCP or here for further evaluation.

## 2023-03-08 ENCOUNTER — Ambulatory Visit
Admission: RE | Admit: 2023-03-08 | Discharge: 2023-03-08 | Disposition: A | Discharge status: Home / Self Care | Source: Ambulatory Visit | Attending: Emergency Medicine | Admitting: Emergency Medicine

## 2023-03-08 VITALS — BP 120/77 | HR 79 | Temp 98.0°F | Resp 18 | Ht 64.0 in | Wt 160.0 lb

## 2023-03-08 DIAGNOSIS — S39012A Strain of muscle, fascia and tendon of lower back, initial encounter: Secondary | ICD-10-CM | POA: Diagnosis not present

## 2023-03-08 MED ORDER — METHYLPREDNISOLONE 4 MG PO TBPK: ORAL_TABLET | ORAL | 0 refills | Status: AC

## 2023-03-08 MED ORDER — METHOCARBAMOL 500 MG PO TABS: 500.0000 mg | ORAL_TABLET | Freq: Two times a day (BID) | ORAL | 0 refills | Status: AC

## 2023-03-08 NOTE — Discharge Instructions (Addendum)
 Start the steroid Dosepak today and take this as prescribed, please take this with breakfast.  You can use the muscle relaxer up to 2 times daily, do not drink alcohol or drive on this medication as it can cause sedation.  Continue to do warm compresses and gentle stretching as tolerated.  If your back pain persist despite these interventions please follow-up with the sports medicine or orthopedic provider.  Return to clinic for any new or urgent symptoms.

## 2023-03-08 NOTE — ED Triage Notes (Signed)
 Patient c/o low back pain x 1 month, no injury, denies any urinary sx's.  Patient states when she coughs it hurts.  Patient has taken Ibuprofen, Tylenol and Biofreeze.  Applying heating and ice to the area.

## 2023-03-08 NOTE — ED Provider Notes (Signed)
 [facility]    CSN: [id number] Arrival date & time: 03/08/23  1427      History   Chief Complaint Chief Complaint  Patient presents with   Back Pain    Entered by patient    HPI Teresa Blanchard is a 33 y.o. adult.   Patient presents to clinic over concern of left-sided lower back pain that has been ongoing for the past month.  She does not recall any trauma or injuries that caused it, reports she just woke up with low back pain one day.  The pain is unchanged or improved despite numerous interventions such as ibuprofen, Tylenol, Biofreeze, heat and ice.  This morning she was bending down to get something for her kids and her back pain increased.  Back pain does increase with sitting, moving, bending and twisting.  Denies any urinary symptoms, without incontinence or inner leg numbness.  No previous back surgeries or injuries.  The history is provided by the patient and medical records.  Back Pain   Past Medical History:  Diagnosis Date   GERD (gastroesophageal reflux disease)    Medical history non-contributory    Supervision of high risk pregnancy, antepartum 10/29/2015    Clinic  [place] Prenatal Labs Dating  11-12 week [place] Blood type: O/POS/-- (10/27 0907)  Genetic Screen 1 Screen:Nml  AFP: Neg     NIPS: Antibody:NEG (10/27 0907) Anatomic [place] 19 wks, echogenic focus, otherwise nml Rubella: 6.95 (10/27 0907) GTT Early:    n/a         Third trimester: Nml RPR: NON REAC (10/27 4098)  Flu vaccine Declines HBsAg: NEGATIVE (10/27 0907)  TDaP vaccine Declines ([facility]    Patient Active Problem List   Diagnosis Date Noted   Thrombocytopenia affecting pregnancy ([facility]) 01/07/2019   Supervision of other normal pregnancy, antepartum 09/18/2018   Prior perinatal loss in second trimester, antepartum 01/07/2016   History of 1 spontaneous abortion 10/29/2015    Past Surgical History:  Procedure Laterality Date   DILATION AND CURETTAGE, DIAGNOSTIC / THERAPEUTIC       OB History     Gravida  4   Para  2   Term  2   Preterm      AB  1   Living  2      SAB  1   IAB      Ectopic      Multiple  0   Live Births  2            Home Medications    Prior to Admission medications   Medication Sig Start Date End Date Taking? Authorizing Provider  methocarbamol (ROBAXIN) 500 MG tablet Take 1 tablet (500 mg total) by mouth 2 (two) times daily. 03/08/23  Yes Rinaldo Ratel, [place] N, FNP  methylPREDNISolone (MEDROL DOSEPAK) 4 MG TBPK tablet Take as prescribed. 03/08/23  Yes Rinaldo Ratel, [place] N, FNP    Family History Family History  Problem Relation Age of Onset   Thyroid disease Mother    Heart disease Father    Breast cancer Other     Social History Social History   Tobacco Use   Smoking status: Every Day    Current packs/day: 0.75    Average packs/day: 0.8 packs/day for 16.0 years (12.0 ttl pk-yrs)    Types: Cigarettes   Smokeless tobacco: Never  Vaping Use   Vaping status: Some Days  Substance Use Topics   Alcohol use: No   Drug use: No  Allergies   Patient has no known allergies.   Review of Systems Review of Systems  Per HPI   Physical Exam Triage Vital Signs ED Triage Vitals  Encounter Vitals Group     BP 03/08/23 1444 120/77     Systolic BP Percentile --      Diastolic BP Percentile --      Pulse Rate 03/08/23 1444 79     Resp 03/08/23 1444 18     Temp 03/08/23 1444 98 F (36.7 C)     Temp Source 03/08/23 1444 Oral     SpO2 03/08/23 1444 97 %     Weight 03/08/23 1446 160 lb (72.6 kg)     Height 03/08/23 1446 5\' 4"  (1.626 m)     Head Circumference --      Peak Flow --      Pain Score 03/08/23 1446 10     Pain Loc --      Pain Education --      Exclude from Growth Chart --    No data found.  Updated Vital Signs BP 120/77 (BP Location: Right Arm)   Pulse 79   Temp 98 F (36.7 C) (Oral)   Resp 18   Ht 5\' 4"  (1.626 m)   Wt 160 lb (72.6 kg)   LMP 02/25/2023   SpO2 97%   Breastfeeding  No   BMI 27.46 kg/m   Visual Acuity Right Eye Distance:   Left Eye Distance:   Bilateral Distance:    Right Eye Near:   Left Eye Near:    Bilateral Near:     Physical Exam Vitals and nursing note reviewed.  Constitutional:      Appearance: Normal appearance.  HENT:     Head: Normocephalic and atraumatic.     Nose: Nose normal.     Mouth/Throat:     Mouth: Mucous membranes are moist.  Eyes:     Conjunctiva/sclera: Conjunctivae normal.  Cardiovascular:     Rate and Rhythm: Normal rate.  Pulmonary:     Effort: Pulmonary effort is normal. No respiratory distress.  Musculoskeletal:        General: No swelling, tenderness, deformity or signs of injury. Normal range of motion.     Lumbar back: Normal. Negative left straight leg raise test.     Comments: Negative straight leg raise.  Left sided lumbar back pain exacerbated with movement, consistent with muscular etiology.  Skin:    General: Skin is warm and dry.  Neurological:     General: No focal deficit present.     Mental Status: She is alert.  Psychiatric:        Mood and Affect: Mood normal.        Behavior: Behavior is cooperative.      UC Treatments / Results  Labs (all labs ordered are listed, but only abnormal results are displayed) Labs Reviewed - No data to display  EKG   Radiology No results found.  Procedures Procedures (including critical care time)  Medications Ordered in UC Medications - No data to display  Initial Impression / Assessment and Plan / UC Course  I have reviewed the triage vital signs and the nursing notes.  Pertinent labs & imaging results that were available during my care of the patient were reviewed by me and considered in my medical decision making (see chart for details).  Vitals and triage reviewed, patient is hemodynamically stable.  Left-sided lumbar back pain consistent with muscular etiology.  Spine without  step-off or deformity.  Without red flag symptoms such as  trauma, incontinence or cauda equina.  Imaging deferred at this time.  Will trial steroid taper and muscle relaxer.  Encouraged orthopedic follow-up if symptoms persist.  Plan of care, follow-up care return precautions given, no questions at this time.     Final Clinical Impressions(s) / UC Diagnoses   Final diagnoses:  Strain of lumbar region, initial encounter     Discharge Instructions      Start the steroid Dosepak today and take this as prescribed, please take this with breakfast.  You can use the muscle relaxer up to 2 times daily, do not drink alcohol or drive on this medication as it can cause sedation.  Continue to do warm compresses and gentle stretching as tolerated.  If your back pain persist despite these interventions please follow-up with the sports medicine or orthopedic provider.  Return to clinic for any new or urgent symptoms.    ED Prescriptions     Medication Sig Dispense Auth. Provider   methylPREDNISolone (MEDROL DOSEPAK) 4 MG TBPK tablet Take as prescribed. 1 each Mirayah Wren, [place] N, FNP   methocarbamol (ROBAXIN) 500 MG tablet Take 1 tablet (500 mg total) by mouth 2 (two) times daily. 20 tablet Cecia Egge, [place] N, [place]      PDMP not reviewed this encounter.   Tjay Velazquez, [place] N, [place] 03/08/23 1500
